# Patient Record
Sex: Female | Born: 1970
Health system: Southern US, Community
[De-identification: ages and names within clinical notes are randomized; demographics above are authoritative.]

## PROBLEM LIST (undated history)

## (undated) DIAGNOSIS — F419 Anxiety disorder, unspecified: Secondary | ICD-10-CM

## (undated) DIAGNOSIS — E785 Hyperlipidemia, unspecified: Secondary | ICD-10-CM

## (undated) DIAGNOSIS — Z789 Other specified health status: Secondary | ICD-10-CM

## (undated) DIAGNOSIS — Z0282 Encounter for adoption services: Secondary | ICD-10-CM

## (undated) DIAGNOSIS — I10 Essential (primary) hypertension: Secondary | ICD-10-CM

## (undated) DIAGNOSIS — E079 Disorder of thyroid, unspecified: Secondary | ICD-10-CM

## (undated) DIAGNOSIS — I499 Cardiac arrhythmia, unspecified: Secondary | ICD-10-CM

## (undated) HISTORY — PX: BREAST ENHANCEMENT SURGERY: SHX7

## (undated) HISTORY — PX: AUGMENTATION MAMMAPLASTY: SUR837

## (undated) HISTORY — PX: WISDOM TOOTH EXTRACTION: SHX21

## (undated) HISTORY — DX: Hyperlipidemia, unspecified: E78.5

---
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
## 2015-12-11 ENCOUNTER — Emergency Department (HOSPITAL_COMMUNITY): Payer: BLUE CROSS/BLUE SHIELD

## 2015-12-11 ENCOUNTER — Emergency Department (HOSPITAL_COMMUNITY)
Admission: EM | Admit: 2015-12-11 | Discharge: 2015-12-11 | Disposition: A | Discharge status: Home / Self Care | Payer: BLUE CROSS/BLUE SHIELD | Attending: Emergency Medicine | Admitting: Emergency Medicine

## 2015-12-11 ENCOUNTER — Encounter (HOSPITAL_COMMUNITY): Payer: Self-pay

## 2015-12-11 DIAGNOSIS — M79622 Pain in left upper arm: Secondary | ICD-10-CM | POA: Insufficient documentation

## 2015-12-11 DIAGNOSIS — R0682 Tachypnea, not elsewhere classified: Secondary | ICD-10-CM | POA: Diagnosis not present

## 2015-12-11 DIAGNOSIS — M79602 Pain in left arm: Secondary | ICD-10-CM

## 2015-12-11 LAB — CBC
HCT: 39.5 % (ref 36.0–46.0)
Hemoglobin: 12.6 g/dL (ref 12.0–15.0)
MCH: 25.9 pg — ABNORMAL LOW (ref 26.0–34.0)
MCHC: 31.9 g/dL (ref 30.0–36.0)
MCV: 81.1 fL (ref 78.0–100.0)
Platelets: 322 10*3/uL (ref 150–400)
RBC: 4.87 MIL/uL (ref 3.87–5.11)
RDW: 14.4 % (ref 11.5–15.5)
WBC: 5.5 10*3/uL (ref 4.0–10.5)

## 2015-12-11 LAB — BASIC METABOLIC PANEL
Anion gap: 7 (ref 5–15)
BUN: 8 mg/dL (ref 6–20)
CO2: 24 mmol/L (ref 22–32)
Calcium: 9.2 mg/dL (ref 8.9–10.3)
Chloride: 104 mmol/L (ref 101–111)
Creatinine, Ser: 0.56 mg/dL (ref 0.44–1.00)
GFR calc Af Amer: >60 mL/min (ref >60)
GFR calc non Af Amer: >60 mL/min (ref >60)
Glucose, Bld: 101 mg/dL — ABNORMAL HIGH (ref 65–99)
Potassium: 3.7 mmol/L (ref 3.5–5.1)
Sodium: 135 mmol/L (ref 135–145)

## 2015-12-11 LAB — I-STAT TROPONIN, ED: Troponin i, poc: 0 ng/mL (ref 0.00–0.08)

## 2015-12-11 LAB — D-DIMER, QUANTITATIVE (NOT AT ARMC): D-Dimer, Quant: 1.18 ug/mL-FEU — ABNORMAL HIGH (ref 0.00–0.50)

## 2015-12-11 MED ORDER — KETOROLAC TROMETHAMINE 15 MG/ML IJ SOLN
15.0000 mg | Freq: Once | INTRAMUSCULAR | Status: AC
Start: 2015-12-11 — End: 2015-12-11
  Administered 2015-12-11: 15 mg via INTRAVENOUS
  Filled 2015-12-11: qty 1

## 2015-12-11 MED ORDER — IOPAMIDOL (ISOVUE-370) INJECTION 76%
INTRAVENOUS | Status: AC
  Administered 2015-12-11: 100 mL
  Filled 2015-12-11: qty 100

## 2015-12-11 MED ORDER — MELOXICAM 15 MG PO TABS: 15.0000 mg | ORAL_TABLET | Freq: Every day | ORAL | 0 refills | Status: DC

## 2015-12-11 MED ORDER — METOPROLOL TARTRATE 5 MG/5ML IV SOLN
2.5000 mg | Freq: Once | INTRAVENOUS | Status: AC
Start: 2015-12-11 — End: 2015-12-11
  Administered 2015-12-11: 2.5 mg via INTRAVENOUS
  Filled 2015-12-11: qty 5

## 2015-12-11 MED ORDER — HYDROMORPHONE HCL 1 MG/ML IJ SOLN
0.5000 mg | Freq: Once | INTRAMUSCULAR | Status: AC
  Administered 2015-12-11: 0.5 mg via INTRAVENOUS
  Filled 2015-12-11: qty 1

## 2015-12-11 MED ORDER — ACETAMINOPHEN 325 MG PO TABS
650.0000 mg | ORAL_TABLET | Freq: Once | ORAL | Status: AC
  Administered 2015-12-11: 650 mg via ORAL
  Filled 2015-12-11: qty 2

## 2015-12-11 MED ORDER — ONDANSETRON HCL 4 MG/2ML IJ SOLN
4.0000 mg | Freq: Once | INTRAMUSCULAR | Status: AC
Start: 2015-12-11 — End: 2015-12-11
  Administered 2015-12-11: 4 mg via INTRAVENOUS
  Filled 2015-12-11: qty 2

## 2015-12-11 NOTE — ED Notes (Signed)
Report given to Melanie,RN 

## 2015-12-11 NOTE — ED Notes (Signed)
Pt A&Ox4, ambulatory at d/c with steady gait, NAD and states she has all of her belongings with her at d/c.

## 2015-12-11 NOTE — ED Notes (Signed)
Family at bedside. 

## 2015-12-11 NOTE — ED Notes (Signed)
MD at bedside due to pt's HR has increased to 125 again.

## 2015-12-11 NOTE — ED Provider Notes (Signed)
[facility] DEPT Provider Note   CSN: [id number] Arrival date & time: 12/11/15  [id number]  First Provider Contact:  First MD Initiated Contact with Patient 12/11/15 [phone]        History   Chief Complaint Chief Complaint  Patient presents with  . Arm Pain  . Tachycardia    HPI Joanne Hunt is a 45 y.o. female.  HPI   45yF with LUE pain. Denies trauma. Very mild symptoms over past couple days. Worsening this morning while at rest. Pain radiates from shoulder down into hand. Worse in upper arm. Some pain in L lateral neck and L ear pain. Denies numbness, tingling or loss of strength. No worse with movement. No swelling. No rash. Denies CP. Mild dyspnea. Nauseated this morning. Recently diagnosed with hyperthyroid. On metoprolol and just started taking PTU yesterday. No personal cardiac history that she is aware of. Adopted and unsure of family hx other than mother dying of cirrhosis in her 46s.   History reviewed. No pertinent past medical history.  There are no active problems to display for this patient.   History reviewed. No pertinent surgical history.  OB History    No data available       Home Medications    Prior to Admission medications   Not on File    Family History History reviewed. No pertinent family history.  Social History Social History  Substance Use Topics  . Smoking status: Not on file  . Smokeless tobacco: Not on file  . Alcohol use Not on file     Allergies   Review of patient's allergies indicates not on file.   Review of Systems Review of Systems All systems reviewed and negative, other than as noted in HPI.   Physical Exam Updated Vital Signs BP 119/83   Pulse 104   Temp 98.7 F (37.1 C) (Oral)   Resp 25   Ht 5\' 4"  (1.626 m)   Wt 142 lb (64.4 kg)   SpO2 100%   BMI 24.37 kg/m   Physical Exam  Constitutional: She appears well-developed and well-nourished.  Appears anxious. Tearful at times.   HENT:  Head: Normocephalic  and atraumatic.  Right Ear: External ear normal.  Left Ear: External ear normal.  TMs clear b/l  Eyes: Conjunctivae are normal.  Neck: Neck supple.  Cardiovascular: Regular rhythm.   No murmur heard. tachycardic  Pulmonary/Chest: Effort normal and breath sounds normal. No respiratory distress.  Abdominal: Soft. There is no tenderness.  Musculoskeletal: She exhibits no edema.  LUE normal in appearance. Symmetric as compared to R. No tenderness. Able to shoulder shrug and actively range shoulder, elbow and wrist w/o apparent discomfort. Good grip strength. Sensation intact to light touch in all dermatomes. Palpable radial pulse. Lower extremities symmetric as compared to each other. No calf tenderness. Negative Homan's. No palpable cords.   Neurological: She is alert.  Skin: Skin is warm and dry.  Psychiatric: She has a normal mood and affect.  Nursing note and vitals reviewed.    ED Treatments / Results  Labs (all labs ordered are listed, but only abnormal results are displayed) Labs Reviewed  CBC - Abnormal; Notable for the following:       Result Value   MCH 25.9 (*)    All other components within normal limits  BASIC METABOLIC PANEL  Randolm Idol, ED    EKG  EKG Interpretation  Date/Time:  Tuesday December 11 2015 06:29:15 EDT Ventricular Rate:  110 PR Interval:  118 QRS  Duration: 68 QT Interval:  308 QTC Calculation: 416 R Axis:   69 Text Interpretation:  Sinus tachycardia Otherwise normal ECG No old tracing to compare Confirmed by [facility]  MD, DAVID ([id number]) on 12/11/2015 6:41:39 AM       Radiology No results found.  Procedures Procedures (including critical care time)  Medications Ordered in ED Medications - No data to display   Initial Impression / Assessment and Plan / ED Course  I have reviewed the triage vital signs and the nursing notes.  Pertinent labs & imaging results that were available during my care of the patient were reviewed by me and  considered in my medical decision making (see chart for details).  Clinical Course   45yF with LUE pain. Cervical radiculopathy? Radiation from neck down into hand is suggestive. Denies exacerbation with movement though. Neuro exam nonfocal. Consider cardiac but many atypical features. May be some anxiety component. Very anxious on exam. Tachypnea. Mildly tremulous and tearful at times. Seems overly concerned about monitoring equipment such as pulse oximeter being too tight on her finger, etc. Consider PE, but no clear risk factors I can identify. If tachycardic but this could be from hyperthyroid, discomfort, anxiety, etc. NVI.   Final Clinical Impressions(s) / ED Diagnoses   Final diagnoses:  Left arm pain    New Prescriptions New Prescriptions   No medications on file     Virgel Manifold, MD 12/24/15 1041

## 2015-12-11 NOTE — ED Triage Notes (Signed)
Pt reports recent diagnosis of hyper thryoid and has heart racing, pt reports arm pain and not feeling well.

## 2016-02-04 ENCOUNTER — Ambulatory Visit: Payer: Self-pay | Admitting: Surgery

## 2016-02-12 ENCOUNTER — Encounter (HOSPITAL_COMMUNITY): Payer: Self-pay | Admitting: Surgery

## 2016-02-12 DIAGNOSIS — E0500 Thyrotoxicosis with diffuse goiter without thyrotoxic crisis or storm: Secondary | ICD-10-CM | POA: Diagnosis present

## 2016-02-12 NOTE — H&P (Signed)
General Surgery [facility]  Joanne Hunt. Joanne Hunt DOB: February 02, 1971 Married / Language: English / Race: White Female  History of Present Illness  The patient is a 45 year old female who presents with Graves disease.  Patient is referred by [name] for surgical management of Graves' disease. Patient's primary care physician is [name]. Patient was originally diagnosed with Graves' disease approximately 10 years ago following pregnancy. She was started on propylthiouracil and beta-blockade. She was treated for approximately 4 years and then treatment was discontinued. Patient did well for several years but then developed symptoms with weight gain and was evaluated by [name]. She was again found to be hyperthyroid and was started back on beta-blockade. She had some difficulty with anti-thyroid medications. She was seen back by her primary care physician and referred to [name] in endocrinology for further assessment. Patient is now taking metoprolol 50 mg daily. She is not on anti-thyroid medication but her TSH level is currently controlled. She now presents to discuss total thyroidectomy for definitive management of Graves' disease. Patient has had no prior head or neck surgery. There is no family history of Graves' disease or other endocrine neoplasms. Patient notes that tremors are well controlled with beta blockade. She has had no compressive symptoms.   Other Problems Thyroid Disease  Past Surgical History  Breast Augmentation Bilateral. Oral Surgery  Diagnostic Studies History Colonoscopy never Mammogram 1-3 years ago Pap Smear 1-5 years ago  Allergies Codeine Sulfate *ANALGESICS - OPIOID*  Medication History Amphetamine-Dextroamphetamine (20MG  Tablet, Oral) Active. ALPRAZolam (0.5MG  Tablet, Oral) Active. Propylthiouracil (50MG  Tablet, Oral) Active. Medications Reconciled  Social History  Caffeine  use Carbonated beverages, Coffee, Tea. No alcohol use No drug use Tobacco use Never smoker.  Family History Alcohol Abuse Father, Mother. Diabetes Mellitus Father, Mother. Heart Disease Father. Heart disease in female family member before age 72 Hypertension Father. Thyroid problems Father.  Pregnancy / Birth History Age at menarche 7 years. Contraceptive History Contraceptive implant, Oral contraceptives. Gravida 5 Length (months) of breastfeeding 3-6 Maternal age 76-20 Para 4 Regular periods   Review of Systems General Present- Weight Gain. Not Present- Appetite Loss, Chills, Fatigue, Fever, Night Sweats and Weight Loss. Skin Not Present- Change in Wart/Mole, Dryness, Hives, Jaundice, New Lesions, Non-Healing Wounds, Rash and Ulcer. HEENT Present- Hoarseness and Sore Throat. Not Present- Earache, Hearing Loss, Nose Bleed, Oral Ulcers, Ringing in the Ears, Seasonal Allergies, Sinus Pain, Visual Disturbances, Wears glasses/contact lenses and Yellow Eyes. Respiratory Not Present- Bloody sputum, Chronic Cough, Difficulty Breathing, Snoring and Wheezing. Breast Not Present- Breast Mass, Breast Pain, Nipple Discharge and Skin Changes. Cardiovascular Present- Rapid Heart Rate and Shortness of Breath. Not Present- Chest Pain, Difficulty Breathing Lying Down, Leg Cramps, Palpitations and Swelling of Extremities. Gastrointestinal Not Present- Abdominal Pain, Bloating, Bloody Stool, Change in Bowel Habits, Chronic diarrhea, Constipation, Difficulty Swallowing, Excessive gas, Gets full quickly at meals, Hemorrhoids, Indigestion, Nausea, Rectal Pain and Vomiting. Female Genitourinary Present- Frequency. Not Present- Nocturia, Painful Urination, Pelvic Pain and Urgency. Musculoskeletal Not Present- Back Pain, Joint Pain, Joint Stiffness, Muscle Pain, Muscle Weakness and Swelling of Extremities. Neurological Present- Headaches and Tremor. Not Present- Decreased Memory, Fainting,  Numbness, Seizures, Tingling, Trouble walking and Weakness. Psychiatric Present- Anxiety, Change in Sleep Pattern, Depression, Fearful and Frequent crying. Not Present- Bipolar. Endocrine Present- Excessive Hunger, Heat Intolerance and Hot flashes. Not Present- Cold Intolerance, Hair Changes and New Diabetes. Hematology Not Present- Blood Thinners, Easy Bruising, Excessive bleeding, Gland problems,  HIV and Persistent Infections.  Vitals Weight: 151 lb Height: 64in Body Surface Area: 1.74 m Body Mass Index: 25.92 kg/m  Temp.: 98.45F(Temporal)  Pulse: 84 (Regular)  BP: 128/86 (Sitting, Left Arm, Standard)   Physical Exam The physical exam findings are as follows: Note:General - appears comfortable, no distress; not diaphorectic  HEENT - normocephalic; sclerae clear, gaze conjugate; mucous membranes moist, dentition good; voice normal  Neck - asymmetric on extension; no palpable anterior or posterior cervical adenopathy; no palpable masses in the thyroid bed; right thyroid lobe slightly larger than the left; thyroid gland is slightly firm without discrete or dominant mass  Chest - clear bilaterally without rhonchi, rales, or wheeze  Cor - regular rhythm with normal rate; no significant murmur  Ext - non-tender without significant edema or lymphedema  Neuro - grossly intact; no tremor   Assessment & Plan  GRAVES' DISEASE (E05.00)  Pt Education - Pamphlet Given - The Thyroid Book: discussed with patient and provided information.  Patient presented from endocrinology for surgical management of Graves' disease. Patient is provided with written literature on thyroid surgery to review at home.  I have recommended proceeding with total thyroidectomy for surgical management of Graves' disease. The patient and I have discussed the procedure in detail. We have discussed the risk and benefits including the risk of injury to recurrent laryngeal nerves and parathyroid glands. We  have discussed the need for lifelong thyroid hormone replacement. We have discussed the hospital stay to be anticipated. We have discussed the cosmetic results to be anticipated. She understands and wishes to proceed with surgery in the near future.  The risks and benefits of the procedure have been discussed at length with the patient. The patient understands the proposed procedure, potential alternative treatments, and the course of recovery to be expected. All of the patient's questions have been answered at this time. The patient wishes to proceed with surgery.  Earnstine Regal, MD, [facility] Office: [phone]

## 2016-02-13 ENCOUNTER — Encounter (HOSPITAL_COMMUNITY): Payer: Self-pay | Admitting: Emergency Medicine

## 2016-02-13 ENCOUNTER — Other Ambulatory Visit: Payer: Self-pay

## 2016-02-13 ENCOUNTER — Encounter (HOSPITAL_COMMUNITY)
Admission: RE | Admit: 2016-02-13 | Discharge: 2016-02-13 | Disposition: A | Discharge status: Home / Self Care | Payer: BLUE CROSS/BLUE SHIELD | Source: Ambulatory Visit | Attending: Surgery | Admitting: Surgery

## 2016-02-13 ENCOUNTER — Emergency Department (HOSPITAL_COMMUNITY)
Admission: EM | Admit: 2016-02-13 | Discharge: 2016-02-16 | Disposition: A | Discharge status: Home / Self Care | Payer: BLUE CROSS/BLUE SHIELD | Attending: Emergency Medicine | Admitting: Emergency Medicine

## 2016-02-13 ENCOUNTER — Other Ambulatory Visit (HOSPITAL_COMMUNITY): Payer: BLUE CROSS/BLUE SHIELD

## 2016-02-13 DIAGNOSIS — Z79899 Other long term (current) drug therapy: Secondary | ICD-10-CM | POA: Diagnosis not present

## 2016-02-13 DIAGNOSIS — E0590 Thyrotoxicosis, unspecified without thyrotoxic crisis or storm: Secondary | ICD-10-CM

## 2016-02-13 HISTORY — DX: Disorder of thyroid, unspecified: E07.9

## 2016-02-13 LAB — BASIC METABOLIC PANEL
ANION GAP: 9 (ref 5–15)
BUN: 10 mg/dL (ref 6–20)
CALCIUM: 9.3 mg/dL (ref 8.9–10.3)
CO2: 26 mmol/L (ref 22–32)
Chloride: 104 mmol/L (ref 101–111)
Creatinine, Ser: 0.69 mg/dL (ref 0.44–1.00)
GFR calc Af Amer: >60 mL/min (ref >60)
GFR calc non Af Amer: >60 mL/min (ref >60)
GLUCOSE: 134 mg/dL — AB (ref 65–99)
POTASSIUM: 3.6 mmol/L (ref 3.5–5.1)
Sodium: 139 mmol/L (ref 135–145)

## 2016-02-13 LAB — I-STAT TROPONIN, ED: Troponin i, poc: 0.01 ng/mL (ref 0.00–0.08)

## 2016-02-13 LAB — TSH: TSH: 0.014 u[IU]/mL — ABNORMAL LOW (ref 0.350–4.500)

## 2016-02-13 LAB — CBC
HEMATOCRIT: 42 % (ref 36.0–46.0)
HEMOGLOBIN: 14 g/dL (ref 12.0–15.0)
MCH: 27.5 pg (ref 26.0–34.0)
MCHC: 33.3 g/dL (ref 30.0–36.0)
MCV: 82.4 fL (ref 78.0–100.0)
Platelets: 348 10*3/uL (ref 150–400)
RBC: 5.1 MIL/uL (ref 3.87–5.11)
RDW: 15 % (ref 11.5–15.5)
WBC: 4.1 10*3/uL (ref 4.0–10.5)

## 2016-02-13 LAB — T4, FREE: Free T4: 3.7 ng/dL — ABNORMAL HIGH (ref 0.61–1.12)

## 2016-02-13 MED ORDER — METHIMAZOLE 10 MG PO TABS
10.0000 mg | ORAL_TABLET | Freq: Two times a day (BID) | ORAL | 0 refills | Status: DC
Start: 2016-02-13 — End: 2016-03-03

## 2016-02-13 MED ORDER — METOPROLOL TARTRATE 50 MG PO TABS: 50.0000 mg | ORAL_TABLET | Freq: Two times a day (BID) | ORAL | 0 refills | Status: DC

## 2016-02-13 NOTE — ED Triage Notes (Signed)
Per pt, states she came for pre op procedures-having thyroid removed tomorrow-started having chest pain and fast HR-has not taking lopressor today

## 2016-02-13 NOTE — Progress Notes (Signed)
  [facility]  02/13/2016  Assessment & Plan: Hyperthyroidism - uncontrolled  Clinically hyperthyroid with tachycardia and hypertension, anxiety, tremulous  Discussed with Dr. Smith Hunt (anesthesia) and Dr. Laneta Hunt (ER)  Dr. Laneta Hunt contacted endocrinology (Dr. Chalmers Hunt)  Will start methimazole 10 mg BID and encourage compliance with beta blocker  Follow up to be arranged with endocrinology  Will cancel OR tomorrow and reschedule when clinically stable (4-6 weeks)        Joanne Regal, MD, [facility]       Office: [phone]    Subjective: Patient presented for pre-op evaluation with tachycardia, hypertension  Objective: Vital signs in last 24 hours: Temp:  [98 F (36.7 C)] 98 F (36.7 C) (09/27 1312) Pulse Rate:  [119-146] 128 (09/27 1509) Resp:  [17-31] 31 (09/27 1509) BP: (135-159)/(74-92) 155/74 (09/27 1509) SpO2:  [98 %-100 %] 100 % (09/27 1509) Weight:  [68 kg (150 lb)] 68 kg (150 lb) (09/27 1312)    Intake/Output from previous day: No intake/output data recorded. Intake/Output this shift: No intake/output data recorded.  Physical Exam: HEENT - sclerae clear, mucous membranes moist Neck - soft, non-tender Chest - clear bilaterally Cor - tachycardic 140's Ext - no edema, non-tender Neuro - anxious, tremulous, no focal deficits  Lab Results:   Recent Labs  02/13/16 1321  WBC 4.1  HGB 14.0  HCT 42.0  PLT 348   BMET  Recent Labs  02/13/16 1321  NA 139  K 3.6  CL 104  CO2 26  GLUCOSE 134*  BUN 10  CREATININE 0.69  CALCIUM 9.3   PT/INR No results for input(s): LABPROT, INR in the last 72 hours. Comprehensive Metabolic Panel:    Component Value Date/Time   NA 139 02/13/2016 1321   NA 135 12/11/2015 0636   K 3.6 02/13/2016 1321   K 3.7 12/11/2015 0636   CL 104 02/13/2016 1321   CL 104 12/11/2015 0636   CO2 26 02/13/2016 1321   CO2 24 12/11/2015 0636   BUN 10 02/13/2016 1321   BUN 8 12/11/2015 0636   CREATININE 0.69 02/13/2016 1321   CREATININE 0.56 12/11/2015 0636   GLUCOSE 134 (H) 02/13/2016 1321   GLUCOSE 101 (H) 12/11/2015 0636   CALCIUM 9.3 02/13/2016 1321   CALCIUM 9.2 12/11/2015 0636    Studies/Results: No results found.    Joanne Hunt M 02/13/2016  Patient ID: Joanne Hunt, female   DOB: [birth year], 45 y.o.   MRN: [id number]

## 2016-02-13 NOTE — Progress Notes (Signed)
Patient arrived for Pre-operative appointment and when hooked up to datamap to get vital signs, patient's heart rate at 127-139. Patient states she did not get her Metoprolol prescription filled and had not taken it today. Patient upset and states that she needs this surgery for her Grave's disease. Patient anxious. Consulted Dr. Suella Broad, Anesthesia and wants patient to go to Emergency room to be seen. Patient taken to Emergency Room at [facility]

## 2016-02-13 NOTE — Patient Instructions (Signed)
Joanne Hunt  02/13/2016   Your procedure is scheduled on: Thursday 02/14/2016  Report to [facility]  Entrance take [place]  elevators to 3rd floor to  [place] at  [place]   AM.  Call this number if you have problems the morning of surgery [phone]   Remember: ONLY 1 PERSON MAY GO WITH YOU TO SHORT STAY TO GET  READY MORNING OF [place].    Do not eat food or drink liquids :After Midnight.     Take these medicines the morning of surgery with A SIP OF WATER:   Metoprolol                                You may not have any metal on your body including hair pins and              piercings  Do not wear jewelry, make-up, lotions, powders or perfumes, deodorant             Do not wear nail polish.  Do not shave  48 hours prior to surgery.              Men may shave face and neck.   Do not bring valuables to the hospital. [place].  Contacts, dentures or bridgework may not be worn into surgery.  Leave suitcase in the car. After surgery it may be brought to your room.                  Please read over the following fact sheets you were given: _____________________________________________________________________             [facility] - Preparing for Surgery Before surgery, you can play an important role.  Because skin is not sterile, your skin needs to be as free of germs as possible.  You can reduce the number of germs on your skin by washing with CHG (chlorahexidine gluconate) soap before surgery.  CHG is an antiseptic cleaner which kills germs and bonds with the skin to continue killing germs even after washing. Please DO NOT use if you have an allergy to CHG or antibacterial soaps.  If your skin becomes reddened/irritated stop using the CHG and inform your nurse when you arrive at Short Stay. Do not shave (including legs and underarms) for at least 48 hours prior to the first CHG shower.   You may shave your face/neck. Please follow these instructions carefully:  1.  Shower with CHG Soap the night before surgery and the  morning of Surgery.  2.  If you choose to wash your hair, wash your hair first as usual with your  normal  shampoo.  3.  After you shampoo, rinse your hair and body thoroughly to remove the  shampoo.                           4.  Use CHG as you would any other liquid soap.  You can apply chg directly  to the skin and wash                       Gently with a scrungie or clean washcloth.  5.  Apply the CHG Soap to your body ONLY FROM THE NECK DOWN.   Do not use on face/ open                           Wound or open sores. Avoid contact with eyes, ears mouth and genitals (private parts).                       Wash face,  Genitals (private parts) with your normal soap.             6.  Wash thoroughly, paying special attention to the area where your surgery  will be performed.  7.  Thoroughly rinse your body with warm water from the neck down.  8.  DO NOT shower/wash with your normal soap after using and rinsing off  the CHG Soap.                9.  Pat yourself dry with a clean towel.            10.  Wear clean pajamas.            11.  Place clean sheets on your bed the night of your first shower and do not  sleep with pets. Day of Surgery : Do not apply any lotions/deodorants the morning of surgery.  Please wear clean clothes to the hospital/surgery center.  FAILURE TO FOLLOW THESE INSTRUCTIONS MAY RESULT IN THE CANCELLATION OF YOUR SURGERY PATIENT SIGNATURE_________________________________  NURSE SIGNATURE__________________________________  ________________________________________________________________________

## 2016-02-13 NOTE — Progress Notes (Signed)
12/11/2015- noted in EPIC CXR and EKG

## 2016-02-13 NOTE — ED Provider Notes (Signed)
[facility] DEPT Provider Note   CSN: [id number] Arrival date & time: 02/13/16  1257     History   Chief Complaint Chief Complaint  Patient presents with  . elevated HR    HPI Joanne Hunt is a 45 y.o. female.  The history is provided by the patient.  Palpitations   This is a recurrent problem. The current episode started 1 to 2 hours ago. The problem occurs constantly. The problem has been gradually improving. The problem is associated with anxiety (missed medications). On average, each episode lasts 1 day. Pertinent negatives include no diaphoresis, no fever, no abdominal pain, no nausea, no vomiting and no headaches. She has tried nothing for the symptoms. Risk factors: graves disease. Her past medical history is significant for hyperthyroidism.    Past Medical History:  Diagnosis Date  . Thyroid disease     Patient Active Problem List   Diagnosis Date Noted  . Graves' disease 02/12/2016    No past surgical history.  OB History    No data available       Home Medications    Prior to Admission medications   Medication Sig Start Date End Date Taking? Authorizing Provider  ALPRAZolam Duanne Moron) 0.5 MG tablet Take 0.5 mg by mouth 2 (two) times daily as needed for anxiety. 01/03/16  Yes Historical Provider, MD  amphetamine-dextroamphetamine (ADDERALL) 20 MG tablet Take 20 mg by mouth daily as needed (for adhd).  01/24/16  Yes Historical Provider, MD  ibuprofen (ADVIL,MOTRIN) 200 MG tablet Take 400 mg by mouth every 6 (six) hours as needed for cramping.   Yes Historical Provider, MD  metoprolol (LOPRESSOR) 50 MG tablet Take 50 mg by mouth 2 (two) times daily.   Yes Historical Provider, MD  meloxicam (MOBIC) 15 MG tablet Take 1 tablet (15 mg total) by mouth daily. Patient not taking: Reported on 02/13/2016 12/11/15   Virgel Manifold, MD    Family History No family history on file.  Social History Social History  Substance Use Topics  . Smoking status: Not on file  .  Smokeless tobacco: Not on file  . Alcohol use Not on file     Allergies   Ptu [propylthiouracil] and Codeine   Review of Systems Review of Systems  Constitutional: Negative for diaphoresis and fever.  Cardiovascular: Positive for palpitations.  Gastrointestinal: Negative for abdominal pain, nausea and vomiting.  Neurological: Negative for headaches.  All other systems reviewed and are negative.    Physical Exam Updated Vital Signs BP 135/92   Pulse 119   Temp 98 F (36.7 C) (Oral)   Resp 23   Ht 5\' 4"  (1.626 m)   Wt 150 lb (68 kg)   SpO2 100%   BMI 25.75 kg/m   Physical Exam  Constitutional: She is oriented to person, place, and time. She appears well-developed and well-nourished. No distress.  HENT:  Head: Normocephalic.  Eyes: Conjunctivae are normal.  Neck: Neck supple. No tracheal deviation present.  Cardiovascular: Regular rhythm.  Tachycardia present.   Pulmonary/Chest: Effort normal. No respiratory distress.  Abdominal: Soft. She exhibits no distension.  Musculoskeletal: Normal range of motion.  Neurological: She is alert and oriented to person, place, and time.  Skin: Skin is warm and dry.  Psychiatric: She has a normal mood and affect.     ED Treatments / Results  Labs (all labs ordered are listed, but only abnormal results are displayed) Labs Reviewed  BASIC METABOLIC PANEL - Abnormal; Notable for the following:  Result Value   Glucose, Bld 134 (*)    All other components within normal limits  TSH - Abnormal; Notable for the following:    TSH 0.014 (*)    All other components within normal limits  CBC  T4, FREE  I-STAT TROPOININ, ED    EKG  EKG Interpretation  Date/Time:  Wednesday February 13 2016 13:12:30 EDT Ventricular Rate:  131 PR Interval:    QRS Duration: 87 QT Interval:  299 QTC Calculation: 442 R Axis:   46 Text Interpretation:  Sinus tachycardia Probable left atrial enlargement Otherwise normal ECG No significant  change since last tracing Confirmed by Kynadie Yaun MD, Angelina Neece [id number]) on 02/13/2016 1:40:50 PM       Radiology No results found.  Procedures Procedures (including critical care time)  Medications Ordered in ED Medications - No data to display   Initial Impression / Assessment and Plan / ED Course  I have reviewed the triage vital signs and the nursing notes.  Pertinent labs & imaging results that were available during my care of the patient were reviewed by me and considered in my medical decision making (see chart for details).  45 y.o. female presents with tachycardia from preoperative clinic. Initially in 140s but down to low 110s with reassurance and monitoring. HRan out of her metoprolol yesterday. Dr Harlow Asa at bedside, appreciate his input. I agree with assessment Pt will need more optimal medical management pre-operatively. Discussed with Dr Valentino Nose who is Pt's endocrinologist and will start on low dose methimazole as her TSH is severely depressed suggesting active hyperthyroid. No signs of active storm currently or indication for inpatient management. Refilled BB Rx. Pt to f/u in endocrinology clinic this week for ongoing management of medications and will be reassessed for surgery at a later date. Plan to follow up with PCP as needed and return precautions discussed for worsening or new concerning symptoms.   Final Clinical Impressions(s) / ED Diagnoses   Final diagnoses:  Hyperthyroidism    New Prescriptions New Prescriptions   METHIMAZOLE (TAPAZOLE) 10 MG TABLET    Take 1 tablet (10 mg total) by mouth 2 (two) times daily.     Leo Grosser, MD 02/13/16 [phone]

## 2016-02-14 ENCOUNTER — Encounter (HOSPITAL_COMMUNITY): Admission: RE | Payer: Self-pay | Source: Ambulatory Visit

## 2016-02-14 ENCOUNTER — Ambulatory Visit (HOSPITAL_COMMUNITY): Admission: RE | Admit: 2016-02-14 | Payer: BLUE CROSS/BLUE SHIELD | Source: Ambulatory Visit | Admitting: Surgery

## 2016-02-14 SURGERY — THYROIDECTOMY
Anesthesia: General

## 2016-02-14 NOTE — Discharge Planning (Signed)
[facility] reviewed discharging chart for possible CM needs.  No needs identified.    Follow-up appointment made?n/a  Does patient have transportation Issues? n/a  Medication needs? Pt insured, no new Rx written.  Equipment Needs? N/a                                       Oxygen Needs? n/a  PT equipment ordered? n/a  H.H needed? n/a                                                H.H ordered? N/a  Amandajo Gonder J. Clydene Laming, [place], [place], [place]

## 2016-02-19 ENCOUNTER — Ambulatory Visit: Payer: Self-pay | Admitting: Surgery

## 2016-02-26 NOTE — Progress Notes (Signed)
Patient scheduled for preop.  Received and placed on chart last office visit note from Dr Chalmers Cater- endocrinologist.  Called patient and asked her where labs scheduled for 02/21/2016 had been done.  Patient stated she went to [facility] on 02/21/2016 in the am and pm to have her labs done.  Patient stated she had orders with her and Dr Chalmers Cater office was aware she was going there to have labs done.  Patient stated she was there for a long time each time and no one ever came out to get her to have her labs done.  The information above was called and notified to the Triage Nurse of Dr Debbora Presto for further followup.

## 2016-02-28 ENCOUNTER — Encounter (HOSPITAL_COMMUNITY)
Admission: RE | Admit: 2016-02-28 | Discharge: 2016-02-28 | Disposition: A | Discharge status: Home / Self Care | Payer: BLUE CROSS/BLUE SHIELD | Source: Ambulatory Visit | Attending: Surgery | Admitting: Surgery

## 2016-02-28 ENCOUNTER — Encounter (HOSPITAL_COMMUNITY): Payer: Self-pay

## 2016-02-28 DIAGNOSIS — Z01812 Encounter for preprocedural laboratory examination: Secondary | ICD-10-CM | POA: Insufficient documentation

## 2016-02-28 DIAGNOSIS — E0500 Thyrotoxicosis with diffuse goiter without thyrotoxic crisis or storm: Secondary | ICD-10-CM | POA: Diagnosis not present

## 2016-02-28 HISTORY — DX: Cardiac arrhythmia, unspecified: I49.9

## 2016-02-28 HISTORY — DX: Anxiety disorder, unspecified: F41.9

## 2016-02-28 HISTORY — DX: Other specified health status: Z78.9

## 2016-02-28 HISTORY — DX: Encounter for adoption services: Z02.82

## 2016-02-28 LAB — HCG, SERUM, QUALITATIVE: PREG SERUM: NEGATIVE

## 2016-02-28 LAB — CBC
HCT: 42.4 % (ref 36.0–46.0)
Hemoglobin: 14.1 g/dL (ref 12.0–15.0)
MCH: 27.5 pg (ref 26.0–34.0)
MCHC: 33.3 g/dL (ref 30.0–36.0)
MCV: 82.7 fL (ref 78.0–100.0)
Platelets: 366 K/uL (ref 150–400)
RBC: 5.13 MIL/uL — ABNORMAL HIGH (ref 3.87–5.11)
RDW: 14.4 % (ref 11.5–15.5)
WBC: 4.9 K/uL (ref 4.0–10.5)

## 2016-02-28 NOTE — Pre-Procedure Instructions (Addendum)
EKG 02-13-16 Epic.CXR 12-11-15 Epic.

## 2016-02-28 NOTE — Patient Instructions (Addendum)
Joanne Hunt  02/28/2016   Your procedure is scheduled on: 03-03-16  Report to [facility] Main  Entrance take [facility]  elevators to 3rd floor to  [place] at   [place] AM.  Call this number if you have problems the morning of surgery [phone]   Remember: ONLY 1 PERSON MAY GO WITH YOU TO SHORT STAY TO GET  READY MORNING OF [place].  Do not eat food or drink liquids :After Midnight.     Take these medicines the morning of surgery with A SIP OF WATER: Methimazole. Metoprolol. DO NOT TAKE ANY DIABETIC MEDICATIONS DAY OF YOUR SURGERY                               You may not have any metal on your body including hair pins and              piercings  Do not wear jewelry, make-up, lotions, powders or perfumes, deodorant             Do not wear nail polish.  Do not shave  48 hours prior to surgery.              Men may shave face and neck.   Do not bring valuables to the hospital. [place].  Contacts, dentures or bridgework may not be worn into surgery.  Leave suitcase in the car. After surgery it may be brought to your room.     Patients discharged the day of surgery will not be allowed to drive home.  Name and phone number of your driver: Randall Hiss- spouse [phone] cell  Special Instructions: N/A              Please read over the following fact sheets you were given: _____________________________________________________________________             [facility] - Preparing for Surgery Before surgery, you can play an important role.  Because skin is not sterile, your skin needs to be as free of germs as possible.  You can reduce the number of germs on your skin by washing with CHG (chlorahexidine gluconate) soap before surgery.  CHG is an antiseptic cleaner which kills germs and bonds with the skin to continue killing germs even after washing. Please DO NOT use if you have an allergy to CHG or  antibacterial soaps.  If your skin becomes reddened/irritated stop using the CHG and inform your nurse when you arrive at Short Stay. Do not shave (including legs and underarms) for at least 48 hours prior to the first CHG shower.  You may shave your face/neck. Please follow these instructions carefully:  1.  Shower with CHG Soap the night before surgery and the  morning of Surgery.  2.  If you choose to wash your hair, wash your hair first as usual with your  normal  shampoo.  3.  After you shampoo, rinse your hair and body thoroughly to remove the  shampoo.                           4.  Use CHG as you would any other liquid soap.  You can apply chg  directly  to the skin and wash                       Gently with a scrungie or clean washcloth.  5.  Apply the CHG Soap to your body ONLY FROM THE NECK DOWN.   Do not use on face/ open                           Wound or open sores. Avoid contact with eyes, ears mouth and genitals (private parts).                       Wash face,  Genitals (private parts) with your normal soap.             6.  Wash thoroughly, paying special attention to the area where your surgery  will be performed.  7.  Thoroughly rinse your body with warm water from the neck down.  8.  DO NOT shower/wash with your normal soap after using and rinsing off  the CHG Soap.                9.  Pat yourself dry with a clean towel.            10.  Wear clean pajamas.            11.  Place clean sheets on your bed the night of your first shower and do not  sleep with pets. Day of Surgery : Do not apply any lotions/deodorants the morning of surgery.  Please wear clean clothes to the hospital/surgery center.  FAILURE TO FOLLOW THESE INSTRUCTIONS MAY RESULT IN THE CANCELLATION OF YOUR SURGERY PATIENT SIGNATURE_________________________________  NURSE SIGNATURE__________________________________  ________________________________________________________________________

## 2016-02-29 NOTE — Pre-Procedure Instructions (Signed)
02-29-16 1100 AM- labs done 02-27-16 CBC/d, Free T4, Hepatic panel, TSH results placed with chart.

## 2016-03-02 ENCOUNTER — Encounter (HOSPITAL_COMMUNITY): Payer: Self-pay | Admitting: Surgery

## 2016-03-03 ENCOUNTER — Ambulatory Visit (HOSPITAL_COMMUNITY): Payer: BLUE CROSS/BLUE SHIELD | Admitting: Anesthesiology

## 2016-03-03 ENCOUNTER — Ambulatory Visit (HOSPITAL_COMMUNITY): Payer: BLUE CROSS/BLUE SHIELD

## 2016-03-03 ENCOUNTER — Encounter (HOSPITAL_COMMUNITY)
Admission: RE | Disposition: A | Discharge status: Home / Self Care | Payer: Self-pay | Source: Ambulatory Visit | Attending: Surgery

## 2016-03-03 ENCOUNTER — Observation Stay (HOSPITAL_COMMUNITY)
Admission: RE | Admit: 2016-03-03 | Discharge: 2016-03-04 | Disposition: A | Discharge status: Home / Self Care | Payer: BLUE CROSS/BLUE SHIELD | Source: Ambulatory Visit | Attending: Surgery | Admitting: Surgery

## 2016-03-03 ENCOUNTER — Encounter (HOSPITAL_COMMUNITY): Payer: Self-pay | Admitting: *Deleted

## 2016-03-03 DIAGNOSIS — Z885 Allergy status to narcotic agent status: Secondary | ICD-10-CM | POA: Insufficient documentation

## 2016-03-03 DIAGNOSIS — E063 Autoimmune thyroiditis: Secondary | ICD-10-CM | POA: Insufficient documentation

## 2016-03-03 DIAGNOSIS — Z01818 Encounter for other preprocedural examination: Secondary | ICD-10-CM

## 2016-03-03 DIAGNOSIS — E0500 Thyrotoxicosis with diffuse goiter without thyrotoxic crisis or storm: Secondary | ICD-10-CM | POA: Diagnosis not present

## 2016-03-03 HISTORY — PX: THYROIDECTOMY: SHX17

## 2016-03-03 SURGERY — THYROIDECTOMY
Anesthesia: General | Site: Neck

## 2016-03-03 MED ORDER — ONDANSETRON HCL 4 MG/2ML IJ SOLN
INTRAMUSCULAR | Status: DC | PRN
  Administered 2016-03-03: 4 mg via INTRAVENOUS

## 2016-03-03 MED ORDER — KETOROLAC TROMETHAMINE 30 MG/ML IJ SOLN
30.0000 mg | Freq: Once | INTRAMUSCULAR | Status: AC
  Administered 2016-03-03: 30 mg via INTRAVENOUS

## 2016-03-03 MED ORDER — LIDOCAINE 2% (20 MG/ML) 5 ML SYRINGE
INTRAMUSCULAR | Status: DC | PRN
  Administered 2016-03-03: 80 mg via INTRAVENOUS

## 2016-03-03 MED ORDER — ACETAMINOPHEN 325 MG PO TABS: 650.0000 mg | ORAL_TABLET | Freq: Four times a day (QID) | ORAL | Status: DC | PRN

## 2016-03-03 MED ORDER — ACETAMINOPHEN 650 MG RE SUPP
650.0000 mg | Freq: Four times a day (QID) | RECTAL | Status: DC | PRN
Start: 2016-03-03 — End: 2016-03-04

## 2016-03-03 MED ORDER — METOPROLOL TARTRATE 50 MG PO TABS
50.0000 mg | ORAL_TABLET | Freq: Two times a day (BID) | ORAL | Status: DC
  Administered 2016-03-03 – 2016-03-04 (×2): 50 mg via ORAL
  Filled 2016-03-03 (×2): qty 1

## 2016-03-03 MED ORDER — ALPRAZOLAM 0.5 MG PO TABS: 0.5000 mg | ORAL_TABLET | Freq: Two times a day (BID) | ORAL | Status: DC | PRN

## 2016-03-03 MED ORDER — SUFENTANIL CITRATE 50 MCG/ML IV SOLN
INTRAVENOUS | Status: DC | PRN
  Administered 2016-03-03: 5 ug via INTRAVENOUS
  Administered 2016-03-03: 10 ug via INTRAVENOUS
  Administered 2016-03-03 (×2): 5 ug via INTRAVENOUS

## 2016-03-03 MED ORDER — CEFAZOLIN SODIUM-DEXTROSE 2-4 GM/100ML-% IV SOLN
INTRAVENOUS | Status: AC
  Filled 2016-03-03: qty 100

## 2016-03-03 MED ORDER — FENTANYL CITRATE (PF) 100 MCG/2ML IJ SOLN
INTRAMUSCULAR | Status: DC | PRN
  Administered 2016-03-03 (×2): 50 ug via INTRAVENOUS

## 2016-03-03 MED ORDER — LACTATED RINGERS IV SOLN
INTRAVENOUS | Status: DC
  Administered 2016-03-03: 12:00:00 via INTRAVENOUS
  Administered 2016-03-03: 1000 mL via INTRAVENOUS
  Administered 2016-03-03: 11:00:00 via INTRAVENOUS

## 2016-03-03 MED ORDER — ACETAMINOPHEN 10 MG/ML IV SOLN
INTRAVENOUS | Status: AC
  Filled 2016-03-03: qty 100

## 2016-03-03 MED ORDER — MIDAZOLAM HCL 2 MG/2ML IJ SOLN
INTRAMUSCULAR | Status: DC | PRN
  Administered 2016-03-03: 2 mg via INTRAVENOUS

## 2016-03-03 MED ORDER — HYDROCODONE-ACETAMINOPHEN 5-325 MG PO TABS
1.0000 | ORAL_TABLET | ORAL | Status: DC | PRN
  Administered 2016-03-03: 1 via ORAL
  Administered 2016-03-04: 2 via ORAL
  Administered 2016-03-04: 1 via ORAL
  Filled 2016-03-03: qty 2
  Filled 2016-03-03 (×2): qty 1

## 2016-03-03 MED ORDER — ONDANSETRON 4 MG PO TBDP: 4.0000 mg | ORAL_TABLET | Freq: Four times a day (QID) | ORAL | Status: DC | PRN

## 2016-03-03 MED ORDER — HYDROMORPHONE HCL 1 MG/ML IJ SOLN
0.2500 mg | INTRAMUSCULAR | Status: DC | PRN
  Administered 2016-03-03 (×4): 0.5 mg via INTRAVENOUS

## 2016-03-03 MED ORDER — HYDROMORPHONE HCL 1 MG/ML IJ SOLN
INTRAMUSCULAR | Status: AC
  Filled 2016-03-03: qty 1

## 2016-03-03 MED ORDER — PROPOFOL 10 MG/ML IV BOLUS
INTRAVENOUS | Status: DC | PRN
  Administered 2016-03-03: 120 mg via INTRAVENOUS

## 2016-03-03 MED ORDER — 0.9 % SODIUM CHLORIDE (POUR BTL) OPTIME
TOPICAL | Status: DC | PRN
  Administered 2016-03-03: 1000 mL

## 2016-03-03 MED ORDER — ONDANSETRON HCL 4 MG/2ML IJ SOLN: 4.0000 mg | Freq: Four times a day (QID) | INTRAMUSCULAR | Status: DC | PRN

## 2016-03-03 MED ORDER — PROMETHAZINE HCL 25 MG/ML IJ SOLN: 6.2500 mg | INTRAMUSCULAR | Status: DC | PRN

## 2016-03-03 MED ORDER — CEFAZOLIN SODIUM-DEXTROSE 2-4 GM/100ML-% IV SOLN
2.0000 g | INTRAVENOUS | Status: AC
  Administered 2016-03-03: 2 g via INTRAVENOUS
  Filled 2016-03-03: qty 100

## 2016-03-03 MED ORDER — ROCURONIUM BROMIDE 10 MG/ML (PF) SYRINGE
PREFILLED_SYRINGE | INTRAVENOUS | Status: DC | PRN
  Administered 2016-03-03: 40 mg via INTRAVENOUS
  Administered 2016-03-03: 10 mg via INTRAVENOUS

## 2016-03-03 MED ORDER — SUGAMMADEX SODIUM 200 MG/2ML IV SOLN
INTRAVENOUS | Status: DC | PRN
  Administered 2016-03-03: 200 mg via INTRAVENOUS

## 2016-03-03 MED ORDER — HYDROMORPHONE HCL 1 MG/ML IJ SOLN
1.0000 mg | INTRAMUSCULAR | Status: DC | PRN
Start: 2016-03-03 — End: 2016-03-04
  Administered 2016-03-03 (×2): 1 mg via INTRAVENOUS
  Filled 2016-03-03 (×2): qty 1

## 2016-03-03 MED ORDER — CALCIUM CARBONATE 1250 (500 CA) MG PO TABS
2.0000 | ORAL_TABLET | Freq: Three times a day (TID) | ORAL | Status: DC
  Administered 2016-03-03 – 2016-03-04 (×2): 1000 mg via ORAL
  Filled 2016-03-03 (×3): qty 1

## 2016-03-03 MED ORDER — KCL IN DEXTROSE-NACL 20-5-0.45 MEQ/L-%-% IV SOLN
INTRAVENOUS | Status: DC
  Administered 2016-03-03: 15:00:00 via INTRAVENOUS
  Filled 2016-03-03 (×2): qty 1000

## 2016-03-03 MED ORDER — KETOROLAC TROMETHAMINE 30 MG/ML IJ SOLN
INTRAMUSCULAR | Status: AC
  Filled 2016-03-03: qty 1

## 2016-03-03 MED ORDER — PHENYLEPHRINE HCL 10 MG/ML IJ SOLN
INTRAMUSCULAR | Status: DC | PRN
  Administered 2016-03-03 (×5): 40 ug via INTRAVENOUS
  Administered 2016-03-03: 80 ug via INTRAVENOUS

## 2016-03-03 MED ORDER — ACETAMINOPHEN 10 MG/ML IV SOLN
1000.0000 mg | Freq: Once | INTRAVENOUS | Status: AC
  Administered 2016-03-03: 1000 mg via INTRAVENOUS

## 2016-03-03 MED ORDER — AMPHETAMINE-DEXTROAMPHETAMINE 20 MG PO TABS: 20.0000 mg | ORAL_TABLET | Freq: Every day | ORAL | Status: DC | PRN

## 2016-03-03 SURGICAL SUPPLY — 39 items
APL SKNCLS STERI-STRIP NONHPOA (GAUZE/BANDAGES/DRESSINGS)
ATTRACTOMAT 16X20 MAGNETIC DRP (DRAPES) ×3 IMPLANT
BENZOIN TINCTURE PRP APPL 2/3 (GAUZE/BANDAGES/DRESSINGS) IMPLANT
BLADE SURG 15 STRL LF DISP TIS (BLADE) ×1 IMPLANT
BLADE SURG 15 STRL SS (BLADE) ×3
CHLORAPREP W/TINT 26ML (MISCELLANEOUS) ×4 IMPLANT
CLIP TI MEDIUM 6 (CLIP) ×8 IMPLANT
CLIP TI WIDE RED SMALL 6 (CLIP) ×8 IMPLANT
CLOSURE STERI-STRIP 1/4X4 (GAUZE/BANDAGES/DRESSINGS) ×2 IMPLANT
CLOSURE WOUND 1/2 X4 (GAUZE/BANDAGES/DRESSINGS) ×1
COVER SURGICAL LIGHT HANDLE (MISCELLANEOUS) ×3 IMPLANT
DISSECTOR ROUND CHERRY 3/8 STR (MISCELLANEOUS) IMPLANT
DRAPE LAPAROTOMY T 98X78 PEDS (DRAPES) ×3 IMPLANT
ELECT PENCIL ROCKER SW 15FT (MISCELLANEOUS) ×3 IMPLANT
ELECT REM PT RETURN 9FT ADLT (ELECTROSURGICAL) ×3
ELECTRODE REM PT RTRN 9FT ADLT (ELECTROSURGICAL) ×1 IMPLANT
GAUZE SPONGE 4X4 12PLY STRL (GAUZE/BANDAGES/DRESSINGS) ×2 IMPLANT
GAUZE SPONGE 4X4 16PLY XRAY LF (GAUZE/BANDAGES/DRESSINGS) ×3 IMPLANT
GLOVE SURG ORTHO 8.0 STRL STRW (GLOVE) ×3 IMPLANT
GOWN STRL REUS W/TWL XL LVL3 (GOWN DISPOSABLE) ×6 IMPLANT
HEMOSTAT SURGICEL 2X4 FIBR (HEMOSTASIS) ×3 IMPLANT
ILLUMINATOR WAVEGUIDE N/F (MISCELLANEOUS) ×2 IMPLANT
KIT BASIN OR (CUSTOM PROCEDURE TRAY) ×3 IMPLANT
LIGHT WAVEGUIDE WIDE FLAT (MISCELLANEOUS) IMPLANT
PACK BASIC VI WITH GOWN DISP (CUSTOM PROCEDURE TRAY) ×3 IMPLANT
SHEARS HARMONIC 9CM CVD (BLADE) ×3 IMPLANT
STAPLER VISISTAT 35W (STAPLE) IMPLANT
STRIP CLOSURE SKIN 1/2X4 (GAUZE/BANDAGES/DRESSINGS) ×2 IMPLANT
SUT MNCRL AB 4-0 PS2 18 (SUTURE) ×3 IMPLANT
SUT SILK 2 0 (SUTURE)
SUT SILK 2-0 18XBRD TIE 12 (SUTURE) IMPLANT
SUT SILK 3 0 (SUTURE)
SUT SILK 3-0 18XBRD TIE 12 (SUTURE) IMPLANT
SUT VIC AB 3-0 SH 18 (SUTURE) ×6 IMPLANT
SYR BULB IRRIGATION 50ML (SYRINGE) ×3 IMPLANT
TAPE CLOTH SURG 4X10 WHT LF (GAUZE/BANDAGES/DRESSINGS) ×2 IMPLANT
TOWEL OR 17X26 10 PK STRL BLUE (TOWEL DISPOSABLE) ×3 IMPLANT
TOWEL OR NON WOVEN STRL DISP B (DISPOSABLE) ×3 IMPLANT
YANKAUER SUCT BULB TIP 10FT TU (MISCELLANEOUS) ×3 IMPLANT

## 2016-03-03 NOTE — Anesthesia Procedure Notes (Signed)
Procedure Name: Intubation Date/Time: 03/03/2016 11:05 AM Performed by: Cynda Familia Pre-anesthesia Checklist: Patient identified, Emergency Drugs available, Suction available and Patient being monitored Patient Re-evaluated:Patient Re-evaluated prior to inductionOxygen Delivery Method: Circle System Utilized Preoxygenation: Pre-oxygenation with 100% oxygen Intubation Type: IV induction Ventilation: Mask ventilation without difficulty Laryngoscope Size: Miller and 2 Grade View: Grade I Tube type: Oral Number of attempts: 1 Airway Equipment and Method: Stylet Placement Confirmation: ETT inserted through vocal cords under direct vision,  positive ETCO2 and breath sounds checked- equal and bilateral Secured at: 22 cm Tube secured with: Tape Dental Injury: Teeth and Oropharynx as per pre-operative assessment  Comments: Smooth IV indcution [place]-- intubation AM CRNA atraumatic--- teeth and mouth as preop-- bilat BS [place]

## 2016-03-03 NOTE — Op Note (Signed)
Procedure Note  Pre-operative Diagnosis:  Graves' disease   Post-operative Diagnosis:  same  Surgeon:  Earnstine Regal, MD, FACS  Assistant:  Georganna Skeans, MD, FACS   Procedure:  Total thyroidectomy  Anesthesia:  General  Estimated Blood Loss:  minimal  Drains: none         Specimen: thyroid to pathology  Indications:  The patient is a 45 year old female who presents with Graves disease.  Patient is referred by [name] for surgical management of Graves' disease. Patient's primary care physician is [name]. Patient was originally diagnosed with Graves' disease approximately 10 years ago following pregnancy. She was started on propylthiouracil and beta-blockade. She was treated for approximately 4 years and then treatment was discontinued. Patient did well for several years but then developed symptoms with weight gain and was evaluated by [name]. She was again found to be hyperthyroid and was started back on beta-blockade. She had some difficulty with anti-thyroid medications. She was seen back by her primary care physician and referred to [name] in endocrinology for further assessment. She now presents for total thyroidectomy for definitive management of Graves' disease.   Procedure Details: Procedure was done in OR #4 at the [facility].  The patient was brought to the operating room and placed in a supine position on the operating room table.  Following administration of general anesthesia, the patient was positioned and then prepped and draped in the usual aseptic fashion.  After ascertaining that an adequate level of anesthesia had been achieved, a Kocher incision was made with #15 blade.  Dissection was carried through subcutaneous tissues and platysma. Hemostasis was achieved with the electrocautery.  Skin flaps were elevated cephalad and caudad from the thyroid notch to the sternal notch.  The [place]ner self-retaining retractor was  placed for exposure.  Strap muscles were incised in the midline and dissection was begun on the left side.  Strap muscles were reflected laterally.  Left thyroid lobe was mildly enlarged without dominant nodules.  The left lobe was gently mobilized with blunt dissection.  Superior pole vessels were dissected out and divided individually between small and medium Ligaclips with the Harmonic scalpel.  The thyroid lobe was rolled anteriorly.  Branches of the inferior thyroid artery were divided between small Ligaclips with the Harmonic scalpel.  Inferior venous tributaries were divided between Ligaclips.  Both the superior and inferior parathyroid glands were identified and preserved on their vascular pedicles.  The recurrent laryngeal nerve was identified and preserved along its course.  The ligament of Gwenlyn Found was released with the electrocautery and the gland was mobilized onto the anterior trachea. Isthmus was mobilized across the midline.  There was a small pyramidal lobe present which was resected en bloc with the isthmus.  Dry pack was placed in the left neck.  Next, the right thyroid lobe was gently mobilized with blunt dissection.  Right thyroid lobe was mildly enlarged without dominant nodules.  Superior pole vessels were dissected out and divided between small and medium Ligaclips with the Harmonic scalpel.  Superior parathyroid was identified and preserved.  Inferior venous tributaries were divided between medium Ligaclips with the Harmonic scalpel.  The right thyroid lobe was rolled anteriorly and the branches of the inferior thyroid artery divided between small Ligaclips.  The right recurrent laryngeal nerve was identified and preserved along its course.  The ligament of Gwenlyn Found was released with the electrocautery.  The right thyroid lobe was mobilized onto the anterior  trachea and the remainder of the thyroid was dissected off the anterior trachea and the thyroid was completely excised.  A suture was used  to mark the right lobe. The entire thyroid gland was submitted to pathology for review.  The neck was irrigated with warm saline.  Fibrillar was placed throughout the operative field.  Strap muscles were reapproximated in the midline with interrupted 3-0 Vicryl sutures.  Platysma was closed with interrupted 3-0 Vicryl sutures.  Skin was closed with a running 4-0 Monocryl subcuticular suture.  Wound was washed and dried and steri-strips were applied.  Dry gauze dressing was placed.  The patient was awakened from anesthesia and brought to the recovery room.  The patient tolerated the procedure well.   Earnstine Regal, MD, [facility] Office: [phone]

## 2016-03-03 NOTE — Interval H&P Note (Signed)
History and Physical Interval Note:  03/03/2016 10:18 AM  Joanne Hunt  has presented today for surgery, with the diagnosis of Graves' disease.  The various methods of treatment have been discussed with the patient and family. After consideration of risks, benefits and other options for treatment, the patient has consented to    Procedure(s): TOTAL THYROIDECTOMY (N/A) as a surgical intervention .    The patient's history has been reviewed, patient examined, no change in status, stable for surgery.  I have reviewed the patient's chart and labs.  Questions were answered to the patient's satisfaction.    Earnstine Regal, MD, [facility] Office: [place]

## 2016-03-03 NOTE — Anesthesia Postprocedure Evaluation (Signed)
Anesthesia Post Note  Patient: Joanne Hunt  Procedure(s) Performed: Procedure(s) (LRB): TOTAL THYROIDECTOMY (N/A)  Patient location during evaluation: PACU Anesthesia Type: General Level of consciousness: awake and alert Pain management: pain level controlled Vital Signs Assessment: post-procedure vital signs reviewed and stable Respiratory status: spontaneous breathing, nonlabored ventilation, respiratory function stable and patient connected to nasal cannula oxygen Cardiovascular status: blood pressure returned to baseline and stable Postop Assessment: no signs of nausea or vomiting Anesthetic complications: no    Last Vitals:  Vitals:   03/03/16 1345 03/03/16 1400  BP: (!) 145/67 (!) 157/67  Pulse: 73 73  Resp: 20 18  Temp: 36.9 C 37.2 C    Last Pain:  Vitals:   03/03/16 1400  TempSrc:   PainSc: 3                  Shree Espey,JAMES TERRILL

## 2016-03-03 NOTE — Anesthesia Preprocedure Evaluation (Addendum)
Anesthesia Evaluation  Patient identified by MRN, date of birth, ID band Patient awake    Reviewed: Allergy & Precautions, NPO status , Patient's Chart, lab work & pertinent test results  History of Anesthesia Complications Negative for: history of anesthetic complications  Airway Mallampati: I  TM Distance: >3 FB Neck ROM: Full    Dental  (+) Teeth Intact   Pulmonary neg pulmonary ROS,    breath sounds clear to auscultation       Cardiovascular + dysrhythmias  Rhythm:Regular Rate:Tachycardia     Neuro/Psych negative neurological ROS     GI/Hepatic negative GI ROS, Neg liver ROS,   Endo/Other  Hyperthyroidism   Renal/GU negative Renal ROS     Musculoskeletal   Abdominal   Peds  Hematology   Anesthesia Other Findings   Reproductive/Obstetrics                            Anesthesia Physical Anesthesia Plan  ASA: II  Anesthesia Plan: General   Post-op Pain Management:    Induction: Intravenous  Airway Management Planned: Oral ETT  Additional Equipment:   Intra-op Plan:   Post-operative Plan:   Informed Consent: I have reviewed the patients History and Physical, chart, labs and discussed the procedure including the risks, benefits and alternatives for the proposed anesthesia with the patient or authorized representative who has indicated his/her understanding and acceptance.   Dental advisory given  Plan Discussed with: CRNA  Anesthesia Plan Comments:         Anesthesia Quick Evaluation

## 2016-03-03 NOTE — Transfer of Care (Signed)
Immediate Anesthesia Transfer of Care Note  Patient: Joanne Hunt  Procedure(s) Performed: Procedure(s): TOTAL THYROIDECTOMY (N/A)  Patient Location: PACU  Anesthesia Type:General  Level of Consciousness: awake and alert   Airway & Oxygen Therapy: Patient Spontanous Breathing and Patient connected to face mask oxygen  Post-op Assessment: Report given to RN and Post -op Vital signs reviewed and stable  Post vital signs: Reviewed and stable  Last Vitals:  Vitals:   03/03/16 0814  BP: 122/81  Pulse: 71  Resp: 16  Temp: 37.2 C    Last Pain:  Vitals:   03/03/16 0814  TempSrc: Oral      Patients Stated Pain Goal: 3 ([id number])  Complications: No apparent anesthesia complications

## 2016-03-03 NOTE — H&P (View-Only) (Signed)
General Surgery [facility]  Joanne Hunt DOB: Aug 24, 1970 Married / Language: English / Race: White Female  History of Present Illness  The patient is a 45 year old female who presents with Graves disease.  Patient is referred by [name] for surgical management of Graves' disease. Patient's primary care physician is [name]. Patient was originally diagnosed with Graves' disease approximately 10 years ago following pregnancy. She was started on propylthiouracil and beta-blockade. She was treated for approximately 4 years and then treatment was discontinued. Patient did well for several years but then developed symptoms with weight gain and was evaluated by [name]. She was again found to be hyperthyroid and was started back on beta-blockade. She had some difficulty with anti-thyroid medications. She was seen back by her primary care physician and referred to [name] in endocrinology for further assessment. Patient is now taking metoprolol 50 mg daily. She is not on anti-thyroid medication but her TSH level is currently controlled. She now presents to discuss total thyroidectomy for definitive management of Graves' disease. Patient has had no prior head or neck surgery. There is no family history of Graves' disease or other endocrine neoplasms. Patient notes that tremors are well controlled with beta blockade. She has had no compressive symptoms.   Other Problems Thyroid Disease  Past Surgical History  Breast Augmentation Bilateral. Oral Surgery  Diagnostic Studies History Colonoscopy never Mammogram 1-3 years ago Pap Smear 1-5 years ago  Allergies Codeine Sulfate *ANALGESICS - OPIOID*  Medication History Amphetamine-Dextroamphetamine (20MG  Tablet, Oral) Active. ALPRAZolam (0.5MG  Tablet, Oral) Active. Propylthiouracil (50MG  Tablet, Oral) Active. Medications Reconciled  Social History  Caffeine  use Carbonated beverages, Coffee, Tea. No alcohol use No drug use Tobacco use Never smoker.  Family History Alcohol Abuse Father, Mother. Diabetes Mellitus Father, Mother. Heart Disease Father. Heart disease in female family member before age 62 Hypertension Father. Thyroid problems Father.  Pregnancy / Birth History Age at menarche 57 years. Contraceptive History Contraceptive implant, Oral contraceptives. Gravida 5 Length (months) of breastfeeding 3-6 Maternal age 81-20 Para 4 Regular periods   Review of Systems General Present- Weight Gain. Not Present- Appetite Loss, Chills, Fatigue, Fever, Night Sweats and Weight Loss. Skin Not Present- Change in Wart/Mole, Dryness, Hives, Jaundice, New Lesions, Non-Healing Wounds, Rash and Ulcer. HEENT Present- Hoarseness and Sore Throat. Not Present- Earache, Hearing Loss, Nose Bleed, Oral Ulcers, Ringing in the Ears, Seasonal Allergies, Sinus Pain, Visual Disturbances, Wears glasses/contact lenses and Yellow Eyes. Respiratory Not Present- Bloody sputum, Chronic Cough, Difficulty Breathing, Snoring and Wheezing. Breast Not Present- Breast Mass, Breast Pain, Nipple Discharge and Skin Changes. Cardiovascular Present- Rapid Heart Rate and Shortness of Breath. Not Present- Chest Pain, Difficulty Breathing Lying Down, Leg Cramps, Palpitations and Swelling of Extremities. Gastrointestinal Not Present- Abdominal Pain, Bloating, Bloody Stool, Change in Bowel Habits, Chronic diarrhea, Constipation, Difficulty Swallowing, Excessive gas, Gets full quickly at meals, Hemorrhoids, Indigestion, Nausea, Rectal Pain and Vomiting. Female Genitourinary Present- Frequency. Not Present- Nocturia, Painful Urination, Pelvic Pain and Urgency. Musculoskeletal Not Present- Back Pain, Joint Pain, Joint Stiffness, Muscle Pain, Muscle Weakness and Swelling of Extremities. Neurological Present- Headaches and Tremor. Not Present- Decreased Memory, Fainting,  Numbness, Seizures, Tingling, Trouble walking and Weakness. Psychiatric Present- Anxiety, Change in Sleep Pattern, Depression, Fearful and Frequent crying. Not Present- Bipolar. Endocrine Present- Excessive Hunger, Heat Intolerance and Hot flashes. Not Present- Cold Intolerance, Hair Changes and New Diabetes. Hematology Not Present- Blood Thinners, Easy Bruising, Excessive bleeding, Gland problems,  HIV and Persistent Infections.  Vitals Weight: 151 lb Height: 64in Body Surface Area: 1.74 m Body Mass Index: 25.92 kg/m  Temp.: 98.61F(Temporal)  Pulse: 84 (Regular)  BP: 128/86 (Sitting, Left Arm, Standard)   Physical Exam The physical exam findings are as follows: Note:General - appears comfortable, no distress; not diaphorectic  HEENT - normocephalic; sclerae clear, gaze conjugate; mucous membranes moist, dentition good; voice normal  Neck - asymmetric on extension; no palpable anterior or posterior cervical adenopathy; no palpable masses in the thyroid bed; right thyroid lobe slightly larger than the left; thyroid gland is slightly firm without discrete or dominant mass  Chest - clear bilaterally without rhonchi, rales, or wheeze  Cor - regular rhythm with normal rate; no significant murmur  Ext - non-tender without significant edema or lymphedema  Neuro - grossly intact; no tremor   Assessment & Plan  GRAVES' DISEASE (E05.00)  Pt Education - Pamphlet Given - The Thyroid Book: discussed with patient and provided information.  Patient presented from endocrinology for surgical management of Graves' disease. Patient is provided with written literature on thyroid surgery to review at home.  I have recommended proceeding with total thyroidectomy for surgical management of Graves' disease. The patient and I have discussed the procedure in detail. We have discussed the risk and benefits including the risk of injury to recurrent laryngeal nerves and parathyroid glands. We  have discussed the need for lifelong thyroid hormone replacement. We have discussed the hospital stay to be anticipated. We have discussed the cosmetic results to be anticipated. She understands and wishes to proceed with surgery in the near future.  The risks and benefits of the procedure have been discussed at length with the patient. The patient understands the proposed procedure, potential alternative treatments, and the course of recovery to be expected. All of the patient's questions have been answered at this time. The patient wishes to proceed with surgery.  Earnstine Regal, MD, [facility] Office: [phone]

## 2016-03-04 DIAGNOSIS — E0500 Thyrotoxicosis with diffuse goiter without thyrotoxic crisis or storm: Secondary | ICD-10-CM | POA: Diagnosis not present

## 2016-03-04 LAB — BASIC METABOLIC PANEL
Anion gap: 6 (ref 5–15)
BUN: 6 mg/dL (ref 6–20)
CALCIUM: 7.6 mg/dL — AB (ref 8.9–10.3)
CO2: 25 mmol/L (ref 22–32)
CREATININE: 0.51 mg/dL (ref 0.44–1.00)
Chloride: 106 mmol/L (ref 101–111)
GFR calc Af Amer: >60 mL/min (ref >60)
GLUCOSE: 121 mg/dL — AB (ref 65–99)
POTASSIUM: 3.7 mmol/L (ref 3.5–5.1)
SODIUM: 137 mmol/L (ref 135–145)

## 2016-03-04 MED ORDER — CALCIUM CARBONATE ANTACID 500 MG PO CHEW: 2.0000 | CHEWABLE_TABLET | Freq: Three times a day (TID) | ORAL | 1 refills | Status: DC

## 2016-03-04 MED ORDER — SODIUM CHLORIDE 0.9 % IV SOLN
2.0000 g | INTRAVENOUS | Status: AC
  Administered 2016-03-04: 2 g via INTRAVENOUS
  Filled 2016-03-04: qty 20

## 2016-03-04 MED ORDER — HYDROCODONE-ACETAMINOPHEN 5-325 MG PO TABS: 1.0000 | ORAL_TABLET | ORAL | 0 refills | Status: DC | PRN

## 2016-03-04 NOTE — Discharge Summary (Signed)
Physician Discharge Summary [facility]  Patient ID: Joanne Hunt MRN: [id number] DOB/AGE: 43-Aug-1972 43 y.o.  Admit date: 03/03/2016 Discharge date: 03/04/2016  Admission Diagnoses:  Graves' disease  Discharge Diagnoses:  Active Problems:   Graves' disease   Discharged Condition: good  Hospital Course: Patient was admitted for observation following thyroid surgery.  Post op course was uncomplicated.  Pain was well controlled.  Tolerated diet.  Post op calcium level on morning following surgery was 7.6 mg/dl.  Patient was given IV calcium gluconate 2 gm prior to discharge.  Patient was prepared for discharge home on POD#1.  Consults: None  Treatments: surgery: total thyroidectomy  Discharge Exam: Blood pressure (!) 112/56, pulse 66, temperature 98 F (36.7 C), temperature source Oral, resp. rate 16, height 5\' 4"  (1.626 m), weight 68.5 kg (151 lb), last menstrual period 02/10/2016, SpO2 98 %. HEENT - clear Neck - wound dry and intact; minimal STS; voice normal Chest - clear bilaterally Cor - RRR   Disposition: Home  Discharge Instructions    Apply dressing    Complete by:  As directed    Apply light gauze dressing to neck before discharge home today.   Diet - low sodium heart healthy    Complete by:  As directed    Increase activity slowly    Complete by:  As directed    Remove dressing in 24 hours    Complete by:  As directed        Medication List    TAKE these medications   ALPRAZolam 0.5 MG tablet Commonly known as:  XANAX Take 0.5 mg by mouth 2 (two) times daily as needed for anxiety.   amphetamine-dextroamphetamine 20 MG tablet Commonly known as:  ADDERALL Take 20 mg by mouth daily as needed (for adhd).   calcium carbonate 500 MG chewable tablet Commonly known as:  TUMS Chew 2 tablets (400 mg of elemental calcium total) by mouth 3 (three) times daily.   HYDROcodone-acetaminophen 5-325 MG tablet Commonly known as:   NORCO/VICODIN Take 1-2 tablets by mouth every 4 (four) hours as needed for moderate pain.   meloxicam 15 MG tablet Commonly known as:  MOBIC Take 1 tablet (15 mg total) by mouth daily.   metoprolol 50 MG tablet Commonly known as:  LOPRESSOR Take 1 tablet (50 mg total) by mouth 2 (two) times daily.      Follow-up Information    Darris Staiger M, MD. Schedule an appointment as soon as possible for a visit in 3 week(s).   Specialty:  General Surgery Contact information: [address] [place] [address] [phone]        Jacelyn Pi, MD. Schedule an appointment as soon as possible for a visit in 3 week(s).   Specialty:  Endocrinology Contact information: [address] [place] [address] [phone]           Earnstine Regal, MD, [facility] Office: [phone]   Signed: Earnstine Regal 03/04/2016, 8:10 AM

## 2016-03-04 NOTE — Progress Notes (Signed)
RN reviewed discharge paperwork with patient. All questions answered.  Patient able to give teach back method. Patient understands when to notify MD of concerns. Patient verbalizes understanding of follow up appointment.   Discharge paperwork and prescriptions given to patient.   NT rolled patient out to family car.

## 2016-03-04 NOTE — Progress Notes (Signed)
Joanne Hunt - Please contact patient and notify of benign pathology results.  Joanne Regal, MD, [facility] Office: [phone]

## 2016-03-04 NOTE — Discharge Instructions (Signed)
[facility] ° °THYROID & PARATHYROID SURGERY:  POST-OP INSTRUCTIONS ° °Always review your discharge instruction sheet from the facility where your surgery was performed. ° °A prescription for pain medication may be given to you upon discharge.  Take your pain medication as prescribed.  If narcotic pain medicine is not needed, then you may take acetaminophen (Tylenol) or ibuprofen (Advil) as needed. ° °Take your usually prescribed medications unless otherwise directed. ° °If you need a refill on your pain medication, please contact your pharmacy. They will contact our office to request authorization.  Prescriptions will not be processed by our office after 5 pm or on weekends. ° °Start with a light diet upon arrival home, such as soup and crackers or toast.  Be sure to drink plenty of fluids daily.  Resume your normal diet the day after surgery. ° °Most patients will experience some swelling and bruising on the chest and neck area.  Ice packs will help.  Swelling and bruising can take several days to resolve.  ° °It is common to experience some constipation after surgery.  Increasing fluid intake and taking a stool softener will usually help or prevent this problem.  A mild laxative (Milk of Magnesia or Miralax) should be taken according to package directions if there has been no bowel movement after 48 hours. ° °You have steri-strips and a gauze dressing over your incision.  You may remove the gauze bandage on the second day after surgery, and you may shower at that time.  Leave your steri-strips (small skin tapes) in place directly over the incision.  These strips should remain on the skin for 5-7 days and then be removed.  You may get them wet in the shower and pat them dry. ° °You may resume regular (light) daily activities beginning the next day - such as daily self-care, walking, climbing stairs - gradually increasing activities as tolerated.  You may have sexual intercourse when it is  comfortable.  Refrain from any heavy lifting or straining until approved by your doctor.  You may drive when you no longer are taking prescription pain medication, you can comfortably wear a seatbelt, and you can safely maneuver your car and apply brakes. ° °You should see your doctor in the office for a follow-up appointment approximately two to three weeks after your surgery.  Make sure that you call for this appointment within a day or two after you arrive home to insure a convenient appointment time. ° °WHEN TO CALL YOUR DOCTOR: °-- Fever greater than 101.5 °-- Inability to urinate °-- Nausea and/or vomiting - persistent °-- Extreme swelling or bruising °-- Continued bleeding from incision °-- Increased pain, redness, or drainage from the incision °-- Difficulty swallowing or breathing °-- Muscle cramping or spasms °-- Numbness or tingling in hands or around lips ° °The clinic staff is available to answer your questions during regular business hours.  Please don’t hesitate to call and ask to speak to one of the nurses if you have concerns. ° °Lurine Imel M. Keah Lamba, MD, FACS °General & Endocrine Surgery °[facility] °Office: [phone] ° °Website: [email or web address] ° ° °

## 2016-03-04 NOTE — Care Management Note (Signed)
Case Management Note  Patient Details  Name: Joanne Hunt MRN: [id number] Date of Birth: [birth year]  Subjective/Objective:  45 y/o f admitted w/Graves Disease. From home.                  Action/Plan:d/c home no needs or orders.   Expected Discharge Date:                  Expected Discharge Plan:  Home/Self Care  In-House Referral:     Discharge planning Services  CM Consult  Post Acute Care Choice:    Choice offered to:     DME Arranged:    DME Agency:     HH Arranged:    [place] Agency:     Status of Service:  Completed, signed off  If discussed at [employer] of Stay Meetings, dates discussed:    Additional Comments:  Dessa Phi, RN 03/04/2016, 9:49 AM

## 2016-03-05 ENCOUNTER — Other Ambulatory Visit (HOSPITAL_COMMUNITY)
Admission: RE | Admit: 2016-03-05 | Discharge: 2016-03-05 | Disposition: A | Discharge status: Home / Self Care | Payer: BLUE CROSS/BLUE SHIELD | Source: Ambulatory Visit | Attending: Surgery | Admitting: Surgery

## 2016-03-05 DIAGNOSIS — E890 Postprocedural hypothyroidism: Secondary | ICD-10-CM | POA: Diagnosis not present

## 2016-03-05 LAB — CALCIUM: Calcium: 7.8 mg/dL — ABNORMAL LOW (ref 8.9–10.3)

## 2016-03-06 ENCOUNTER — Emergency Department (HOSPITAL_COMMUNITY)
Admission: EM | Admit: 2016-03-06 | Discharge: 2016-03-06 | Disposition: A | Discharge status: Home / Self Care | Payer: BLUE CROSS/BLUE SHIELD | Attending: Emergency Medicine | Admitting: Emergency Medicine

## 2016-03-06 ENCOUNTER — Encounter (HOSPITAL_COMMUNITY): Payer: Self-pay | Admitting: Emergency Medicine

## 2016-03-06 DIAGNOSIS — Z79899 Other long term (current) drug therapy: Secondary | ICD-10-CM | POA: Insufficient documentation

## 2016-03-06 DIAGNOSIS — R200 Anesthesia of skin: Secondary | ICD-10-CM | POA: Diagnosis present

## 2016-03-06 LAB — CBC WITH DIFFERENTIAL/PLATELET
BASOS PCT: 0 %
Basophils Absolute: 0 10*3/uL (ref 0.0–0.1)
EOS PCT: 5 %
Eosinophils Absolute: 0.3 10*3/uL (ref 0.0–0.7)
HEMATOCRIT: 38.8 % (ref 36.0–46.0)
Hemoglobin: 13.1 g/dL (ref 12.0–15.0)
LYMPHS PCT: 34 %
Lymphs Abs: 1.9 10*3/uL (ref 0.7–4.0)
MCH: 27.7 pg (ref 26.0–34.0)
MCHC: 33.8 g/dL (ref 30.0–36.0)
MCV: 82 fL (ref 78.0–100.0)
MONO ABS: 0.6 10*3/uL (ref 0.1–1.0)
MONOS PCT: 10 %
NEUTROS ABS: 2.8 10*3/uL (ref 1.7–7.7)
Neutrophils Relative %: 51 %
Platelets: 291 10*3/uL (ref 150–400)
RBC: 4.73 MIL/uL (ref 3.87–5.11)
RDW: 14.1 % (ref 11.5–15.5)
WBC: 5.6 10*3/uL (ref 4.0–10.5)

## 2016-03-06 LAB — COMPREHENSIVE METABOLIC PANEL
ALT: 33 U/L (ref 14–54)
ANION GAP: 8 (ref 5–15)
AST: 32 U/L (ref 15–41)
Albumin: 3.6 g/dL (ref 3.5–5.0)
Alkaline Phosphatase: 109 U/L (ref 38–126)
BILIRUBIN TOTAL: 0.6 mg/dL (ref 0.3–1.2)
BUN: 9 mg/dL (ref 6–20)
CO2: 27 mmol/L (ref 22–32)
Calcium: 8.1 mg/dL — ABNORMAL LOW (ref 8.9–10.3)
Chloride: 103 mmol/L (ref 101–111)
Creatinine, Ser: 0.52 mg/dL (ref 0.44–1.00)
GFR calc Af Amer: >60 mL/min (ref >60)
Glucose, Bld: 98 mg/dL (ref 65–99)
POTASSIUM: 3.6 mmol/L (ref 3.5–5.1)
Sodium: 138 mmol/L (ref 135–145)
TOTAL PROTEIN: 6.8 g/dL (ref 6.5–8.1)

## 2016-03-06 MED ORDER — SODIUM CHLORIDE 0.9 % IV SOLN
1.0000 g | Freq: Once | INTRAVENOUS | Status: AC
  Administered 2016-03-06: 1 g via INTRAVENOUS
  Filled 2016-03-06: qty 10

## 2016-03-06 MED ORDER — SODIUM CHLORIDE 0.9 % IV SOLN
Freq: Once | INTRAVENOUS | Status: AC
  Administered 2016-03-06: 03:00:00 via INTRAVENOUS

## 2016-03-06 NOTE — ED Triage Notes (Addendum)
Pt from home via EMS with complaints of tetany and low calcium (7.8 this morning) Pt had her thyroid removed Monday. Pt also has numbness in her face and on the bottom of her right foot. Pt's surgeon told her to increase her calcium pill intake from 2 pills 3 times a day to 3 pills 4 times a day.

## 2016-03-06 NOTE — ED Notes (Signed)
Bed: [id number] Expected date:  Expected time:  Means of arrival:  Comments: EMS-25yoF postop problems

## 2016-03-06 NOTE — ED Provider Notes (Signed)
[place] DEPT Provider Note   CSN: [id number] Arrival date & time: 03/06/16  0106  By signing my name below, I, Dora Sims, attest that this documentation has been prepared under the direction and in the presence of physician practitioner, Orpah Greek, MD. Electronically Signed: Dora Sims, Scribe. 03/06/2016. 1:39 AM.  History   Chief Complaint Chief Complaint  Patient presents with  . critical lab  . Numbness    The history is provided by the patient. No language interpreter was used.     HPI Comments: Joanne Hunt is a 45 y.o. female brought in by EMS who presents to the Emergency Department complaining of muscle spasm of her right hand beginning several hours ago. Pt states she had her thyroid removed 3 days ago but was not informed that muscle spasms were a potential post-op issue. She has had some regular tingling in her hands, feet, and around her mouth since the surgery as well. She states her right hand spasm is the only one she has experienced since the thyroidectomy. She became concerned for stroke when her right hand spasm presented and called EMS. Pt reports her calcium has been low since the surgery and was measured at 7.8 yesterday morning. Pt increased her calcium pill intake yesterday per recommendation by her surgeon, and has also taken Tums since the spasm tonight. She denies weakness, fever, chills, color change, wound, or any other associated symptoms.   Past Medical History:  Diagnosis Date  . Adopted    "patient is adopted"  . Anxiety   . Dysrhythmia    tachycardia  . Thyroid disease    Graves disease    Patient Active Problem List   Diagnosis Date Noted  . Graves' disease 02/12/2016    Past Surgical History:  Procedure Laterality Date  . THYROIDECTOMY N/A 03/03/2016   Procedure: TOTAL THYROIDECTOMY;  Surgeon: Armandina Gemma, MD;  Location: [facility];  Service: General;  Laterality: N/A;  . WISDOM TOOTH EXTRACTION      OB History     No data available       Home Medications    Prior to Admission medications   Medication Sig Start Date End Date Taking? Authorizing Provider  calcium carbonate (TUMS) 500 MG chewable tablet Chew 2 tablets (400 mg of elemental calcium total) by mouth 3 (three) times daily. 03/04/16  Yes Armandina Gemma, MD  HYDROcodone-acetaminophen (NORCO/VICODIN) 5-325 MG tablet Take 1-2 tablets by mouth every 4 (four) hours as needed for moderate pain. 03/04/16  Yes Armandina Gemma, MD  metoprolol (LOPRESSOR) 50 MG tablet Take 1 tablet (50 mg total) by mouth 2 (two) times daily. 02/13/16  Yes Leo Grosser, MD  meloxicam (MOBIC) 15 MG tablet Take 1 tablet (15 mg total) by mouth daily. Patient not taking: Reported on 03/06/2016 12/11/15   Virgel Manifold, MD    Family History No family history on file.  Social History Social History  Substance Use Topics  . Smoking status: Never Smoker  . Smokeless tobacco: Never Used  . Alcohol use No     Allergies   Ptu [propylthiouracil] and Codeine   Review of Systems Review of Systems  Constitutional: Negative for chills and fever.  Musculoskeletal: Positive for myalgias (muscle spasm of right hand).  Skin: Negative for color change and wound.  Neurological: Positive for numbness. Negative for weakness.  All other systems reviewed and are negative.   Physical Exam Updated Vital Signs BP 151/74 (BP Location: Left Arm)   Pulse 70   Temp  98.2 F (36.8 C) (Oral)   Resp 14   LMP 02/10/2016 (Exact Date)   SpO2 97%   Physical Exam  Constitutional: She is oriented to person, place, and time. She appears well-developed and well-nourished. No distress.  HENT:  Head: Normocephalic and atraumatic.  Right Ear: Hearing normal.  Left Ear: Hearing normal.  Nose: Nose normal.  Mouth/Throat: Oropharynx is clear and moist and mucous membranes are normal.  Eyes: Conjunctivae and EOM are normal. Pupils are equal, round, and reactive to light.  Neck: Normal range  of motion. Neck supple.  Cardiovascular: Regular rhythm, S1 normal and S2 normal.  Exam reveals no gallop and no friction rub.   No murmur heard. Pulmonary/Chest: Effort normal and breath sounds normal. No respiratory distress. She exhibits no tenderness.  Abdominal: Soft. Normal appearance and bowel sounds are normal. There is no hepatosplenomegaly. There is no tenderness. There is no rebound, no guarding, no tenderness at McBurney's point and negative Murphy's sign. No hernia.  Musculoskeletal: Normal range of motion.  Neurological: She is alert and oriented to person, place, and time. She has normal strength. No cranial nerve deficit or sensory deficit. Coordination normal. GCS eye subscore is 4. GCS verbal subscore is 5. GCS motor subscore is 6.  Skin: Skin is warm, dry and intact. No rash noted. No cyanosis.  Psychiatric: She has a normal mood and affect. Her speech is normal and behavior is normal. Thought content normal.  Nursing note and vitals reviewed.   ED Treatments / Results  Labs (all labs ordered are listed, but only abnormal results are displayed) Labs Reviewed  COMPREHENSIVE METABOLIC PANEL - Abnormal; Notable for the following:       Result Value   Calcium 8.1 (*)    All other components within normal limits  CBC WITH DIFFERENTIAL/PLATELET  CALCIUM, IONIZED    EKG  EKG Interpretation None       Radiology No results found.  Procedures Procedures (including critical care time)  DIAGNOSTIC STUDIES: Oxygen Saturation is 97% on RA, normal by my interpretation.    COORDINATION OF CARE: 1:45 AM Discussed treatment plan with pt at bedside and pt agreed to plan.  Medications Ordered in ED Medications  0.9 %  sodium chloride infusion ( Intravenous New Bag/Given 03/06/16 0249)  calcium gluconate 1 g in sodium chloride 0.9 % 100 mL IVPB (1 g Intravenous New Bag/Given 03/06/16 0246)     Initial Impression / Assessment and Plan / ED Course  I have reviewed the  triage vital signs and the nursing notes.  Pertinent labs & imaging results that were available during my care of the patient were reviewed by me and considered in my medical decision making (see chart for details).  Clinical Course    Patient with recent thyroidectomy presents to the ER with complaints of tingling of the hands, feet and around her mouth intermittently followed by spasms of her upper extremities tonight. This was consistent with carpal spasm secondary to her hypokalemia. Patient's calcium was 8.1 today, actually slightly increased from the other day. She is responding to her increased oral calcium. Patient given IV calcium here in the ER and has had resolution of the tingling symptoms. She is appropriate for discharge and continued outpatient calcium replacement. .  Final Clinical Impressions(s) / ED Diagnoses   Final diagnoses:  Hypocalcemia    New Prescriptions New Prescriptions   No medications on file     Orpah Greek, MD 03/06/16 [phone]

## 2016-03-06 NOTE — ED Notes (Signed)
MD at bedside. 

## 2016-03-07 LAB — CALCIUM, IONIZED: Calcium, Ionized, Serum: 4.5 mg/dL (ref 4.5–5.6)

## 2016-05-21 DIAGNOSIS — E890 Postprocedural hypothyroidism: Secondary | ICD-10-CM | POA: Diagnosis not present

## 2016-06-23 DIAGNOSIS — E890 Postprocedural hypothyroidism: Secondary | ICD-10-CM | POA: Diagnosis not present

## 2016-07-14 DIAGNOSIS — L650 Telogen effluvium: Secondary | ICD-10-CM | POA: Diagnosis not present

## 2016-08-20 DIAGNOSIS — L658 Other specified nonscarring hair loss: Secondary | ICD-10-CM | POA: Diagnosis not present

## 2016-09-04 DIAGNOSIS — N6321 Unspecified lump in the left breast, upper outer quadrant: Secondary | ICD-10-CM | POA: Diagnosis not present

## 2016-09-04 DIAGNOSIS — N6320 Unspecified lump in the left breast, unspecified quadrant: Secondary | ICD-10-CM | POA: Diagnosis not present

## 2016-10-14 DIAGNOSIS — L650 Telogen effluvium: Secondary | ICD-10-CM | POA: Diagnosis not present

## 2016-10-17 DIAGNOSIS — D509 Iron deficiency anemia, unspecified: Secondary | ICD-10-CM | POA: Diagnosis not present

## 2016-10-17 DIAGNOSIS — L658 Other specified nonscarring hair loss: Secondary | ICD-10-CM | POA: Diagnosis not present

## 2016-10-30 DIAGNOSIS — E890 Postprocedural hypothyroidism: Secondary | ICD-10-CM | POA: Diagnosis not present

## 2016-11-27 DIAGNOSIS — M47816 Spondylosis without myelopathy or radiculopathy, lumbar region: Secondary | ICD-10-CM | POA: Diagnosis not present

## 2016-11-27 DIAGNOSIS — M25552 Pain in left hip: Secondary | ICD-10-CM | POA: Diagnosis not present

## 2016-11-27 DIAGNOSIS — R7301 Impaired fasting glucose: Secondary | ICD-10-CM | POA: Diagnosis not present

## 2016-11-27 DIAGNOSIS — M545 Low back pain: Secondary | ICD-10-CM | POA: Diagnosis not present

## 2016-11-27 DIAGNOSIS — N390 Urinary tract infection, site not specified: Secondary | ICD-10-CM | POA: Diagnosis not present

## 2016-11-27 DIAGNOSIS — D509 Iron deficiency anemia, unspecified: Secondary | ICD-10-CM | POA: Diagnosis not present

## 2017-01-02 DIAGNOSIS — B379 Candidiasis, unspecified: Secondary | ICD-10-CM | POA: Diagnosis not present

## 2017-01-02 DIAGNOSIS — N3000 Acute cystitis without hematuria: Secondary | ICD-10-CM | POA: Diagnosis not present

## 2017-01-02 DIAGNOSIS — T3695XA Adverse effect of unspecified systemic antibiotic, initial encounter: Secondary | ICD-10-CM | POA: Diagnosis not present

## 2017-01-20 DIAGNOSIS — R399 Unspecified symptoms and signs involving the genitourinary system: Secondary | ICD-10-CM | POA: Diagnosis not present

## 2017-01-20 DIAGNOSIS — E890 Postprocedural hypothyroidism: Secondary | ICD-10-CM | POA: Diagnosis not present

## 2017-01-20 DIAGNOSIS — D509 Iron deficiency anemia, unspecified: Secondary | ICD-10-CM | POA: Diagnosis not present

## 2017-02-10 DIAGNOSIS — N926 Irregular menstruation, unspecified: Secondary | ICD-10-CM | POA: Diagnosis not present

## 2017-02-10 DIAGNOSIS — Z01419 Encounter for gynecological examination (general) (routine) without abnormal findings: Secondary | ICD-10-CM | POA: Diagnosis not present

## 2017-03-10 DIAGNOSIS — E890 Postprocedural hypothyroidism: Secondary | ICD-10-CM | POA: Diagnosis not present

## 2017-03-10 DIAGNOSIS — D509 Iron deficiency anemia, unspecified: Secondary | ICD-10-CM | POA: Diagnosis not present

## 2017-04-20 DIAGNOSIS — Z79899 Other long term (current) drug therapy: Secondary | ICD-10-CM | POA: Diagnosis not present

## 2017-04-20 DIAGNOSIS — L650 Telogen effluvium: Secondary | ICD-10-CM | POA: Diagnosis not present

## 2017-04-22 DIAGNOSIS — E782 Mixed hyperlipidemia: Secondary | ICD-10-CM | POA: Diagnosis not present

## 2017-04-22 DIAGNOSIS — L650 Telogen effluvium: Secondary | ICD-10-CM | POA: Diagnosis not present

## 2017-05-20 DIAGNOSIS — R6889 Other general symptoms and signs: Secondary | ICD-10-CM | POA: Diagnosis not present

## 2017-06-25 DIAGNOSIS — N760 Acute vaginitis: Secondary | ICD-10-CM | POA: Diagnosis not present

## 2017-06-26 DIAGNOSIS — N760 Acute vaginitis: Secondary | ICD-10-CM | POA: Diagnosis not present

## 2017-07-22 ENCOUNTER — Other Ambulatory Visit: Payer: Self-pay

## 2017-07-22 ENCOUNTER — Emergency Department (HOSPITAL_COMMUNITY)
Admission: EM | Admit: 2017-07-22 | Discharge: 2017-07-22 | Disposition: A | Discharge status: Home / Self Care | Payer: Self-pay | Attending: Physician Assistant | Admitting: Physician Assistant

## 2017-07-22 ENCOUNTER — Emergency Department (HOSPITAL_COMMUNITY): Payer: Self-pay

## 2017-07-22 ENCOUNTER — Encounter (HOSPITAL_COMMUNITY): Payer: Self-pay

## 2017-07-22 DIAGNOSIS — R0602 Shortness of breath: Secondary | ICD-10-CM | POA: Insufficient documentation

## 2017-07-22 DIAGNOSIS — R0789 Other chest pain: Secondary | ICD-10-CM

## 2017-07-22 DIAGNOSIS — R51 Headache: Secondary | ICD-10-CM | POA: Insufficient documentation

## 2017-07-22 DIAGNOSIS — R519 Headache, unspecified: Secondary | ICD-10-CM

## 2017-07-22 DIAGNOSIS — R110 Nausea: Secondary | ICD-10-CM | POA: Insufficient documentation

## 2017-07-22 DIAGNOSIS — Z79899 Other long term (current) drug therapy: Secondary | ICD-10-CM | POA: Insufficient documentation

## 2017-07-22 LAB — CBC
HCT: 42.4 % (ref 36.0–46.0)
HEMOGLOBIN: 14.5 g/dL (ref 12.0–15.0)
MCH: 31.9 pg (ref 26.0–34.0)
MCHC: 34.2 g/dL (ref 30.0–36.0)
MCV: 93.4 fL (ref 78.0–100.0)
Platelets: 333 10*3/uL (ref 150–400)
RBC: 4.54 MIL/uL (ref 3.87–5.11)
RDW: 12.6 % (ref 11.5–15.5)
WBC: 5.6 10*3/uL (ref 4.0–10.5)

## 2017-07-22 LAB — BASIC METABOLIC PANEL
ANION GAP: 11 (ref 5–15)
BUN: 11 mg/dL (ref 6–20)
CALCIUM: 9.1 mg/dL (ref 8.9–10.3)
CO2: 24 mmol/L (ref 22–32)
Chloride: 105 mmol/L (ref 101–111)
Creatinine, Ser: 0.82 mg/dL (ref 0.44–1.00)
Glucose, Bld: 94 mg/dL (ref 65–99)
Potassium: 3.5 mmol/L (ref 3.5–5.1)
SODIUM: 140 mmol/L (ref 135–145)

## 2017-07-22 LAB — I-STAT BETA HCG BLOOD, ED (MC, WL, AP ONLY)

## 2017-07-22 LAB — I-STAT TROPONIN, ED: TROPONIN I, POC: 0 ng/mL (ref 0.00–0.08)

## 2017-07-22 NOTE — Discharge Instructions (Signed)
Please follow up closely with your primary care provider for further evaluation of your condition.  Return if you notice vision changes, confusion, weakness in your arm/leg, fever, or if you have other concerns.

## 2017-07-22 NOTE — ED Provider Notes (Signed)
[place] EMERGENCY DEPARTMENT Provider Note   CSN: [id number] Arrival date & time: 07/22/17  1509     History   Chief Complaint Chief Complaint  Patient presents with  . Chest Pain  . Shortness of Breath    HPI Joanne Hunt is a 47 y.o. female.  HPI   47 year old female with history of Graves' disease presenting for evaluation of headache and shortness of breath.  Patient report for the past 2 weeks she has had recurrent left-sided headache.  Describes headache as a throbbing sensation sometimes behind the eye, headache tends to increase with stress.  Sometimes she was experiencing chest pressure and shortness of breath with these episodes.  It has been waxing waning, usually improves with Advil.  This morning her symptoms still persist and she endorsed some nausea which concerns her.  She admits to having history of anxiety.  She also mentioned a recent change in her job which she is trying to cope with.  She denies any exertional chest pain and no prior history of exertional syncope.  She denies any alcohol or drug use.  No prior cardiac disease.  Does have history of thyroid disease currently taking Synthroid.  She has been compliant with her medication.  She denies SI or HI.  Symptom is mostly resolved while waiting in the ER.  Past Medical History:  Diagnosis Date  . Adopted    "patient is adopted"  . Anxiety   . Dysrhythmia    tachycardia  . Thyroid disease    Graves disease    Patient Active Problem List   Diagnosis Date Noted  . Graves' disease 02/12/2016    Past Surgical History:  Procedure Laterality Date  . THYROIDECTOMY N/A 03/03/2016   Procedure: TOTAL THYROIDECTOMY;  Surgeon: Armandina Gemma, MD;  Location: [facility];  Service: General;  Laterality: N/A;  . WISDOM TOOTH EXTRACTION      OB History    No data available       Home Medications    Prior to Admission medications   Medication Sig Start Date End Date Taking? Authorizing  Provider  calcium carbonate (TUMS) 500 MG chewable tablet Chew 2 tablets (400 mg of elemental calcium total) by mouth 3 (three) times daily. 03/04/16   Armandina Gemma, MD  HYDROcodone-acetaminophen (NORCO/VICODIN) 5-325 MG tablet Take 1-2 tablets by mouth every 4 (four) hours as needed for moderate pain. 03/04/16   Armandina Gemma, MD  meloxicam (MOBIC) 15 MG tablet Take 1 tablet (15 mg total) by mouth daily. Patient not taking: Reported on 03/06/2016 12/11/15   Virgel Manifold, MD  metoprolol (LOPRESSOR) 50 MG tablet Take 1 tablet (50 mg total) by mouth 2 (two) times daily. 02/13/16   Leo Grosser, MD    Family History History reviewed. No pertinent family history.  Social History Social History   Tobacco Use  . Smoking status: Never Smoker  . Smokeless tobacco: Never Used  Substance Use Topics  . Alcohol use: No  . Drug use: No     Allergies   Ptu [propylthiouracil] and Codeine   Review of Systems Review of Systems  All other systems reviewed and are negative.    Physical Exam Updated Vital Signs BP (!) 155/97 (BP Location: Right Arm)   Pulse 87   Temp 98.2 F (36.8 C) (Oral)   Resp 18   Ht 5\' 4"  (1.626 m)   Wt 62.1 kg (137 lb)   LMP 07/01/2017 (Exact Date)   SpO2 100%   BMI  23.52 kg/m   Physical Exam  Constitutional: She is oriented to person, place, and time. She appears well-developed and well-nourished. No distress.  HENT:  Head: Atraumatic.  Eyes: Conjunctivae are normal.  Neck: Neck supple. No JVD present. No tracheal deviation present. No thyromegaly present.  No nuchal rigidity  Cardiovascular: Normal rate, regular rhythm, intact distal pulses and normal pulses. Exam reveals no gallop.  No murmur heard. Pulmonary/Chest: Effort normal and breath sounds normal. She has no decreased breath sounds. She has no wheezes. She has no rales.  Abdominal: Soft. She exhibits no mass. There is no tenderness. There is no guarding.  Musculoskeletal: Normal range of  motion.       Right lower leg: She exhibits no edema.       Left lower leg: She exhibits no edema.  Neurological: She is alert and oriented to person, place, and time.  Neurologic exam:  Speech clear, pupils equal round reactive to light, extraocular movements intact  Normal peripheral visual fields Cranial nerves III through XII normal including no facial droop Follows commands, moves all extremities x4, normal strength to bilateral upper and lower extremities at all major muscle groups including grip Sensation normal to light touch Coordination intact, no limb ataxia, finger-nose-finger normal Rapid alternating movements normal No pronator drift Gait normal   Skin: Capillary refill takes less than 2 seconds. No rash noted.  Psychiatric: She has a normal mood and affect.  Nursing note and vitals reviewed.    ED Treatments / Results  Labs (all labs ordered are listed, but only abnormal results are displayed) Labs Reviewed  BASIC METABOLIC PANEL  CBC  I-STAT TROPONIN, ED  I-STAT BETA HCG BLOOD, ED ([facility], [facility], [facility] ONLY)    EKG  EKG Interpretation None      Date: 07/22/2017  Rate: 80  Rhythm: normal sinus rhythm  QRS Axis: normal  Intervals: normal  ST/T Wave abnormalities: normal  Conduction Disutrbances: none  Narrative Interpretation:   Old EKG Reviewed: No significant changes noted     Radiology Dg Chest 2 View  Result Date: 07/22/2017 CLINICAL DATA:  47 year old female with intermittent shortness of breath and headache EXAM: CHEST - 2 VIEW COMPARISON:  Prior chest x-ray 03/03/2016 FINDINGS: The lungs are clear and negative for focal airspace consolidation, pulmonary edema or suspicious pulmonary nodule. No pleural effusion or pneumothorax. Cardiac and mediastinal contours are within normal limits. No acute fracture or lytic or blastic osseous lesions. The visualized upper abdominal bowel gas pattern is unremarkable. IMPRESSION: Negative chest x-ray. Electronically  Signed   By: Jacqulynn Cadet M.D.   On: 07/22/2017 17:50    Procedures Procedures (including critical care time)  Medications Ordered in ED Medications - No data to display   Initial Impression / Assessment and Plan / ED Course  I have reviewed the triage vital signs and the nursing notes.  Pertinent labs & imaging results that were available during my care of the patient were reviewed by me and considered in my medical decision making (see chart for details).     BP (!) 170/100 (BP Location: Left Arm)   Pulse 79   Temp 98.3 F (36.8 C) (Oral)   Resp 16   Ht 5\' 4"  (1.626 m)   Wt 62.1 kg (137 lb)   LMP 07/01/2017 (Exact Date)   SpO2 100%   BMI 23.52 kg/m  The patient was noted to be hypertensive today in the emergency department. I have spoken with the patient regarding hypertension and the  need for improved management. I instructed the patient to followup with the Primary care doctor within 4 days to improve the management of the patient's hypertension. I also counseled the patient regarding the signs and symptoms which would require an emergent visit to an emergency department for hypertensive urgency and/or hypertensive emergency. The patient understood the need for improved hypertensive management.   Final Clinical Impressions(s) / ED Diagnoses   Final diagnoses:  Atypical chest pain  Recurrent headache    ED Discharge Orders    None     7:57 PM Patient here with recurrent left-sided headache.  No significant headache at this time.  No acute onset thunderclap headache concerning for subarachnoid hemorrhage, no fever or nuchal rigidity concerning for meningitis, no focal neuro deficit concerning for stroke or space-occupying lesion.  Suspect headache is related to stress.  No red flags such as IV drug use or active cancer.  Also report chest pain shortness of breath that is brought on by anxiety.  No active pain or shortness of breath currently.  EKG, troponin, chest  x-ray, labs are reassuring.  At this time, I encourage patient to follow-up with primary care provider for further evaluation.  She may needs to have a thyroid level reexam.  Return precautions discussed.   Domenic Moras, PA-C 07/22/17 2043    Macarthur Critchley, MD 07/22/17 [phone]

## 2017-07-22 NOTE — ED Notes (Signed)
b

## 2017-07-22 NOTE — ED Notes (Signed)
Pain reports mild headache 5/10 onset today with mild nausea , no emesis or photophobia .

## 2017-07-22 NOTE — ED Triage Notes (Signed)
Pt endorses intermittent shob and headache x 2 weeks, yesterday pt had left sided chest pain and left shoulder pain, today pt got sick on her stomach. VSS

## 2017-07-23 DIAGNOSIS — Z566 Other physical and mental strain related to work: Secondary | ICD-10-CM | POA: Diagnosis not present

## 2017-07-23 DIAGNOSIS — E890 Postprocedural hypothyroidism: Secondary | ICD-10-CM | POA: Diagnosis not present

## 2017-07-23 DIAGNOSIS — D509 Iron deficiency anemia, unspecified: Secondary | ICD-10-CM | POA: Diagnosis not present

## 2017-07-23 DIAGNOSIS — R030 Elevated blood-pressure reading, without diagnosis of hypertension: Secondary | ICD-10-CM | POA: Diagnosis not present

## 2017-07-27 DIAGNOSIS — R21 Rash and other nonspecific skin eruption: Secondary | ICD-10-CM | POA: Diagnosis not present

## 2017-07-29 DIAGNOSIS — Z0100 Encounter for examination of eyes and vision without abnormal findings: Secondary | ICD-10-CM | POA: Diagnosis not present

## 2017-08-20 DIAGNOSIS — R030 Elevated blood-pressure reading, without diagnosis of hypertension: Secondary | ICD-10-CM | POA: Diagnosis not present

## 2017-08-20 DIAGNOSIS — Z6824 Body mass index (BMI) 24.0-24.9, adult: Secondary | ICD-10-CM | POA: Diagnosis not present

## 2017-08-28 IMAGING — CT CT ANGIO CHEST
2 of 6 series · 18 of 36 positions shown · IV contrast (Omni 300)
Comparison: Chest radiograph December 11, 2015

CLINICAL DATA: Shortness of breath and tachycardia. Elevated
D-dimer.

EXAM:
CT ANGIOGRAPHY CHEST WITH CONTRAST
TECHNIQUE: Multidetector CT imaging of the chest was performed using the
standard protocol during bolus administration of intravenous
contrast. Multiplanar CT image reconstructions and MIPs were
obtained to evaluate the vascular anatomy.
CONTRAST:  100 mL Isovue 370 nonionic

[Series 6: pe thins · axial · 0.53mm/px · z∈[-364,-102]mm · 17 of 296 slices shown]
[im 17/296  lung]
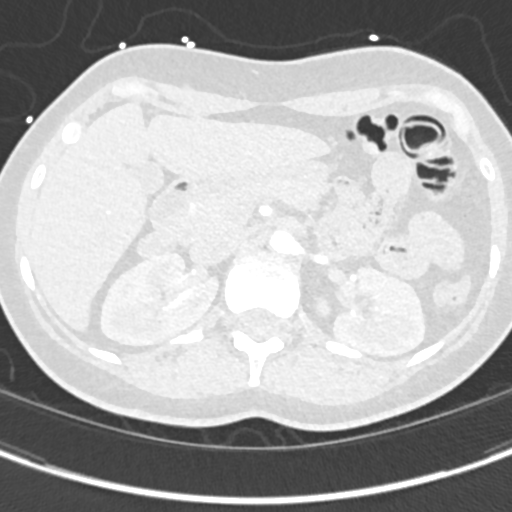
[im 33/296  mediastinal]
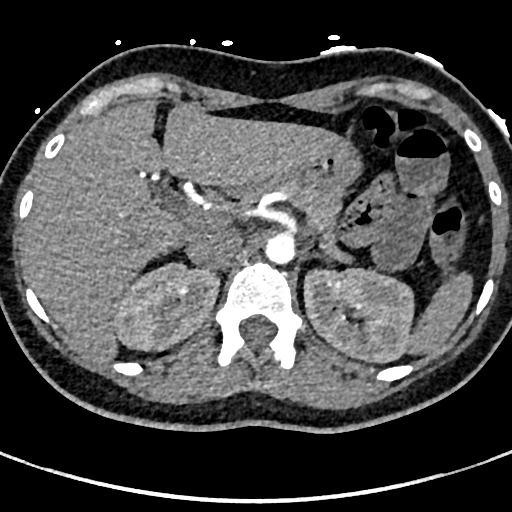
[im 50/296  lung]
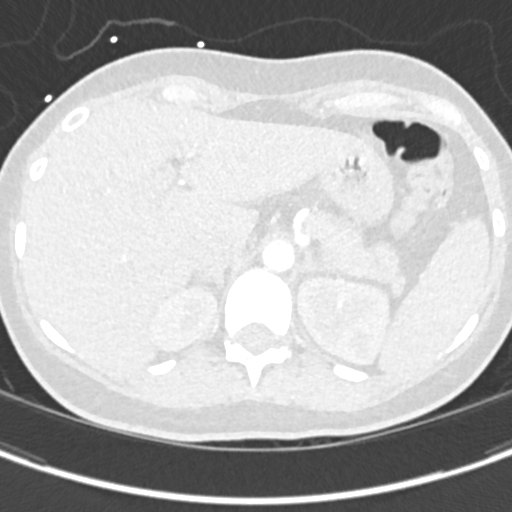
[im 66/296  mediastinal]
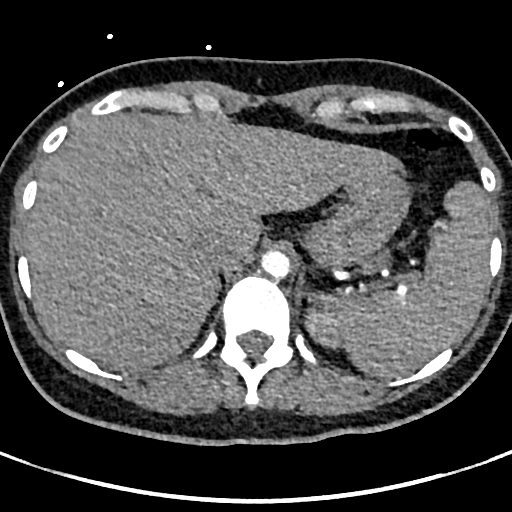
[im 82/296  lung]
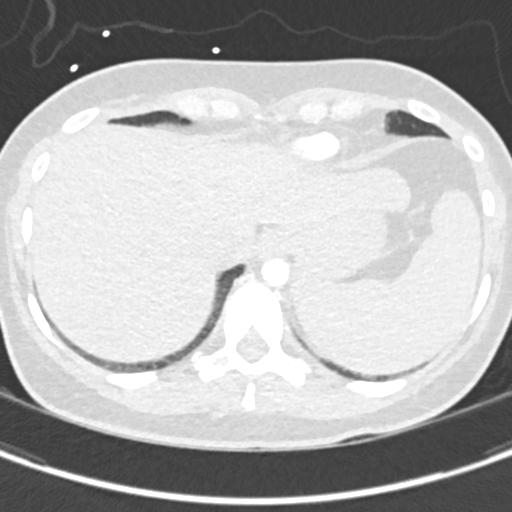
[im 99/296  mediastinal]
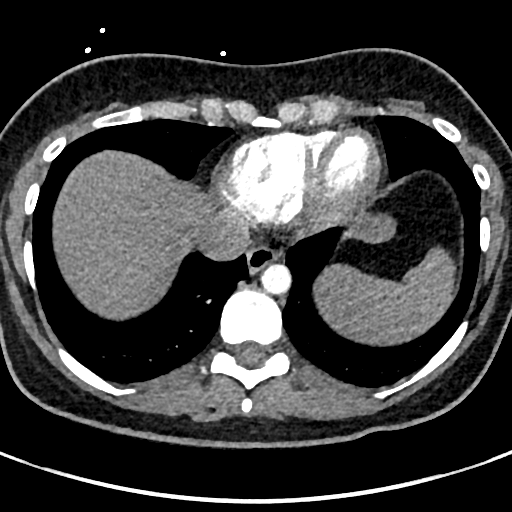
[im 115/296  lung]
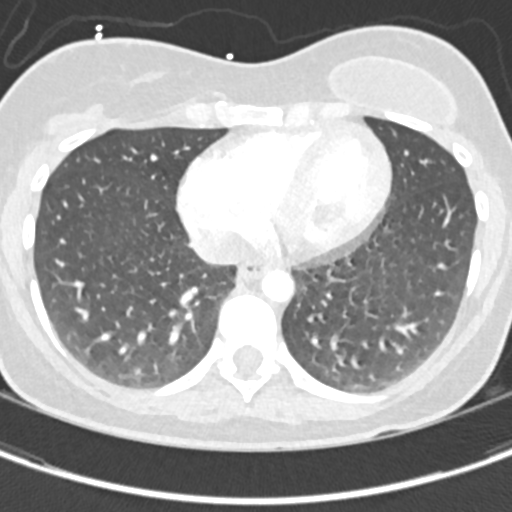
[im 132/296  mediastinal]
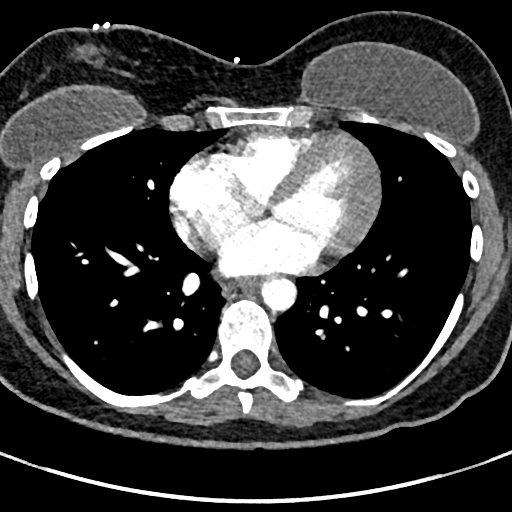
[im 148/296  lung]
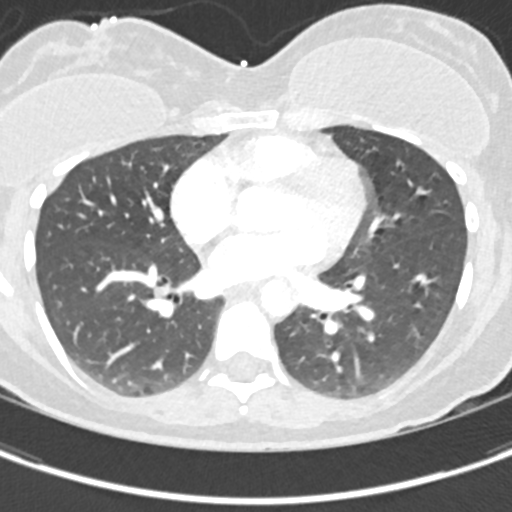
[im 164/296  mediastinal]
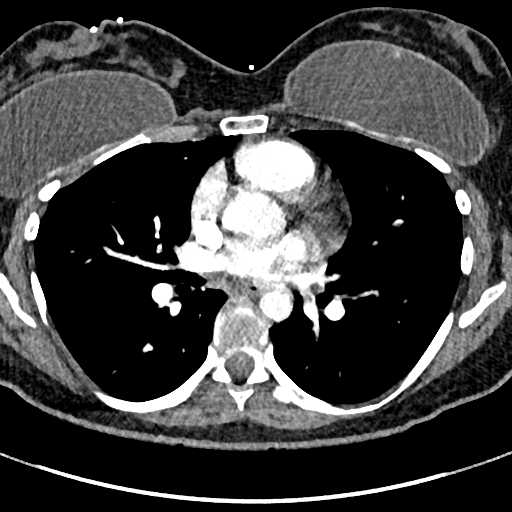
[im 181/296  lung]
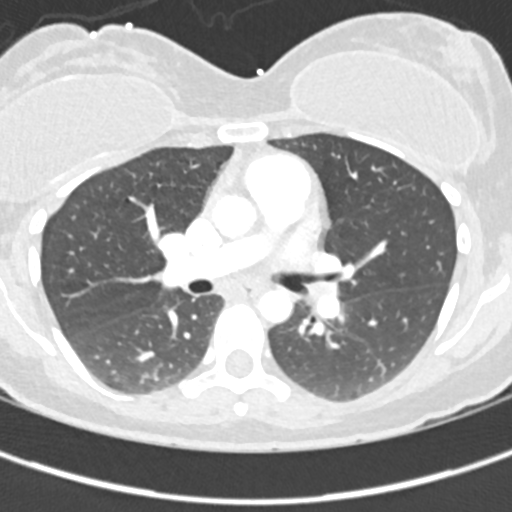
[im 197/296  mediastinal]
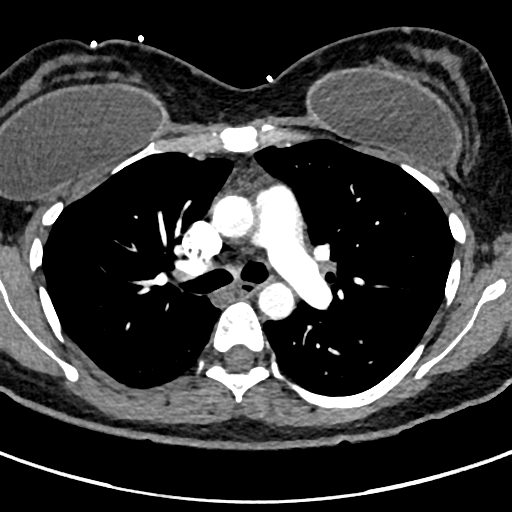
[im 214/296  lung]
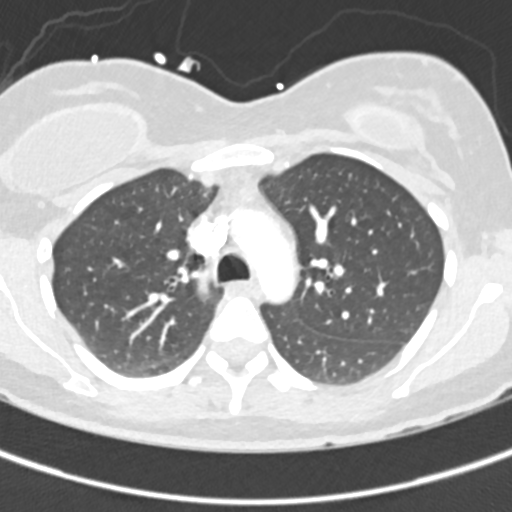
[im 230/296  mediastinal]
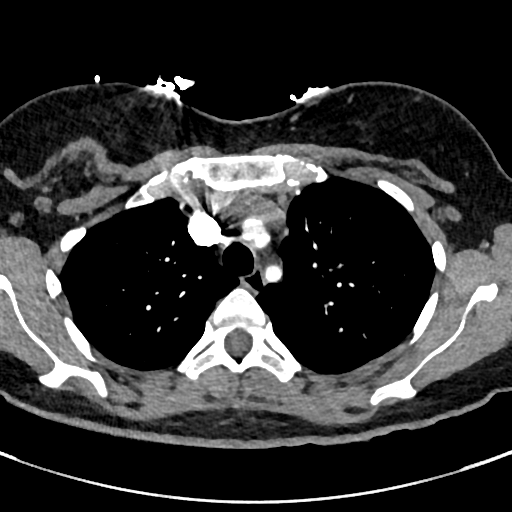
[im 246/296  lung]
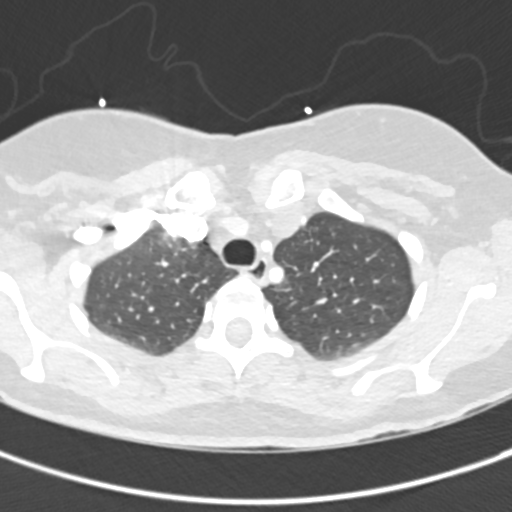
[im 263/296  mediastinal]
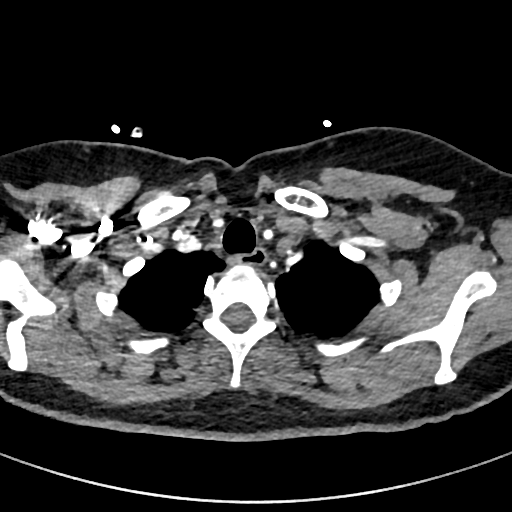
[im 279/296  lung]
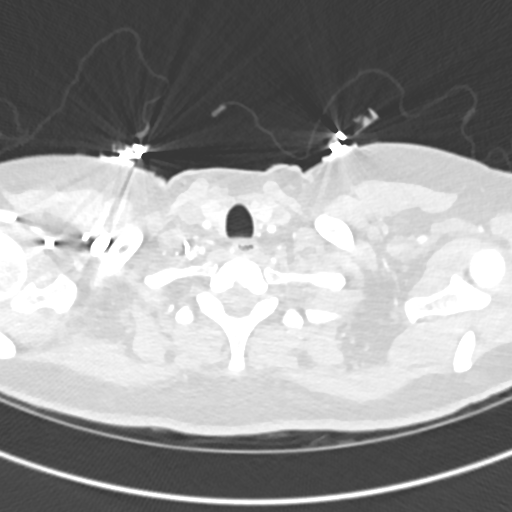

[Series 7: pe 2mm cor · coronal · 0.55mm/px · 1 of 90 slices shown]
[im 45/90  mediastinal]
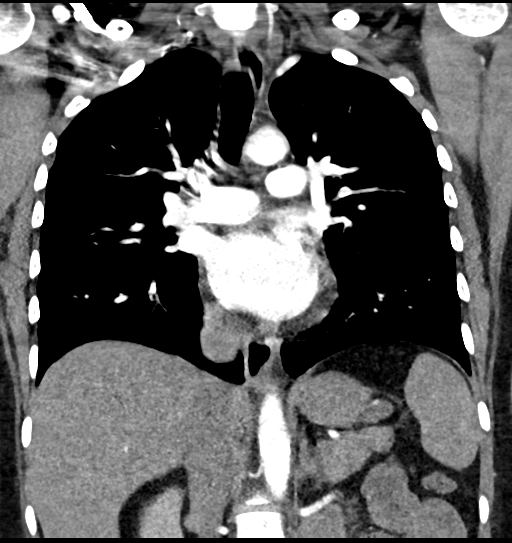

[18 of 36 positions shown; findings below may reference images not displayed]

FINDINGS: Cardiovascular: There is no demonstrable pulmonary embolus. There is
no thoracic aortic aneurysm or dissection. The visualize great
vessels appear unremarkable. The right and left common carotid
arteries arise as a common trunk, an anatomic variant. There is
calcification in the proximal left anterior descending coronary
artery. There is mild left ventricular hypertrophy. Pericardium is
not appreciably thickened.

Mediastinum/Nodes: The thyroid is lobulated without focal dominant
mass. There is no appreciable thoracic adenopathy.

Lungs/Pleura: There is no parenchymal lung edema or consolidation.
There is slight bibasilar atelectasis.

Upper Abdomen: Visualized upper abdominal structures appear
unremarkable.

Musculoskeletal: There are breast implants bilaterally. There are no
blastic or lytic bone lesions.

Review of the MIP images confirms the above findings.
IMPRESSION: No demonstrable pulmonary embolus. No edema or consolidation. No
apparent adenopathy. Bilateral breast implants. Calcification is
noted in the proximal left anterior descending coronary artery.
There is mild left ventricular hypertrophy.

## 2017-11-17 DIAGNOSIS — N939 Abnormal uterine and vaginal bleeding, unspecified: Secondary | ICD-10-CM | POA: Diagnosis not present

## 2017-11-17 DIAGNOSIS — N83291 Other ovarian cyst, right side: Secondary | ICD-10-CM | POA: Diagnosis not present

## 2017-11-17 DIAGNOSIS — N926 Irregular menstruation, unspecified: Secondary | ICD-10-CM | POA: Diagnosis not present

## 2017-11-19 IMAGING — DX DG CHEST 2V
2 series · 2 of 2 positions shown · non-contrast
Comparison: CT 12/11/2015.

CLINICAL DATA: Thyroidectomy.

EXAM:
CHEST  2 VIEW

[chest pa]
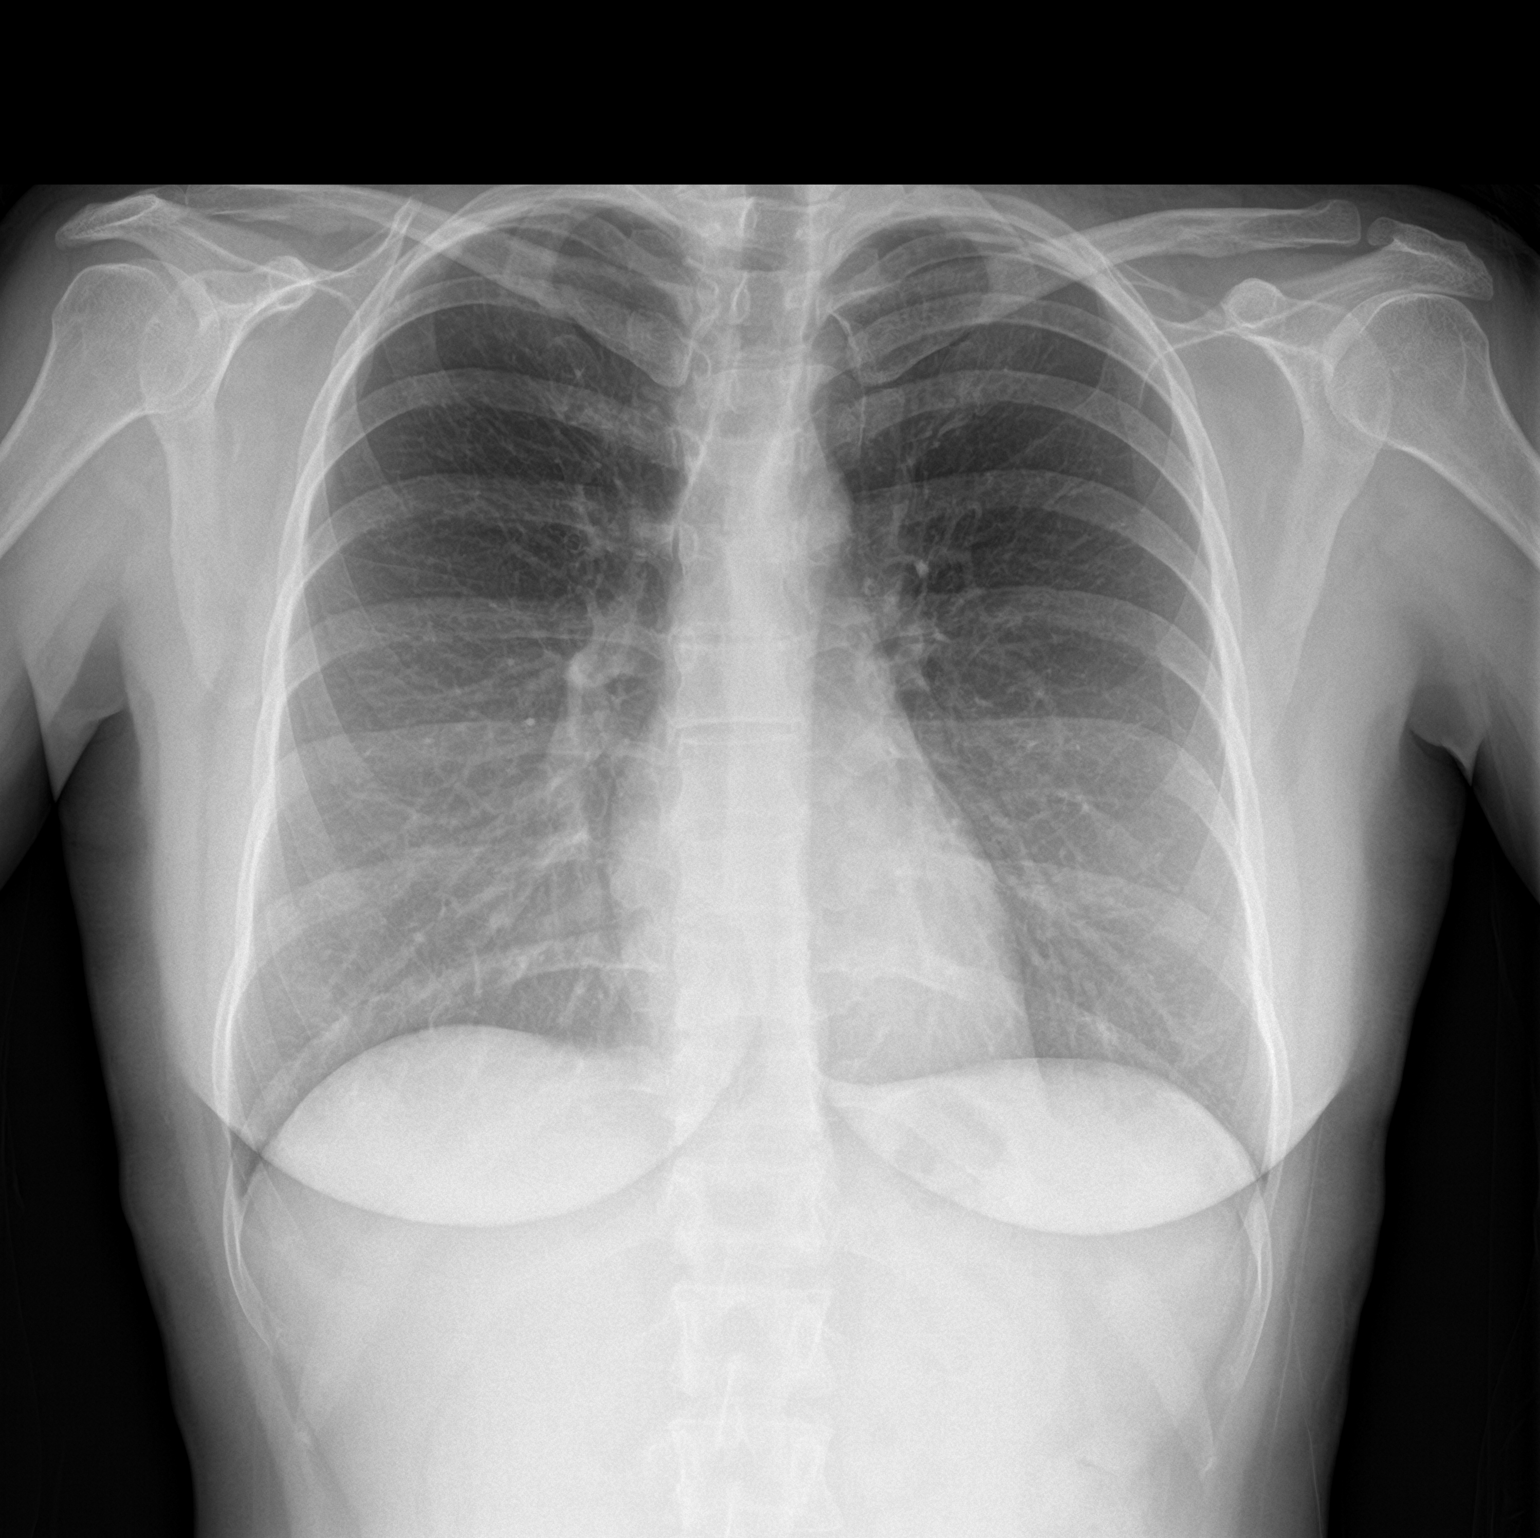

[chest lat]
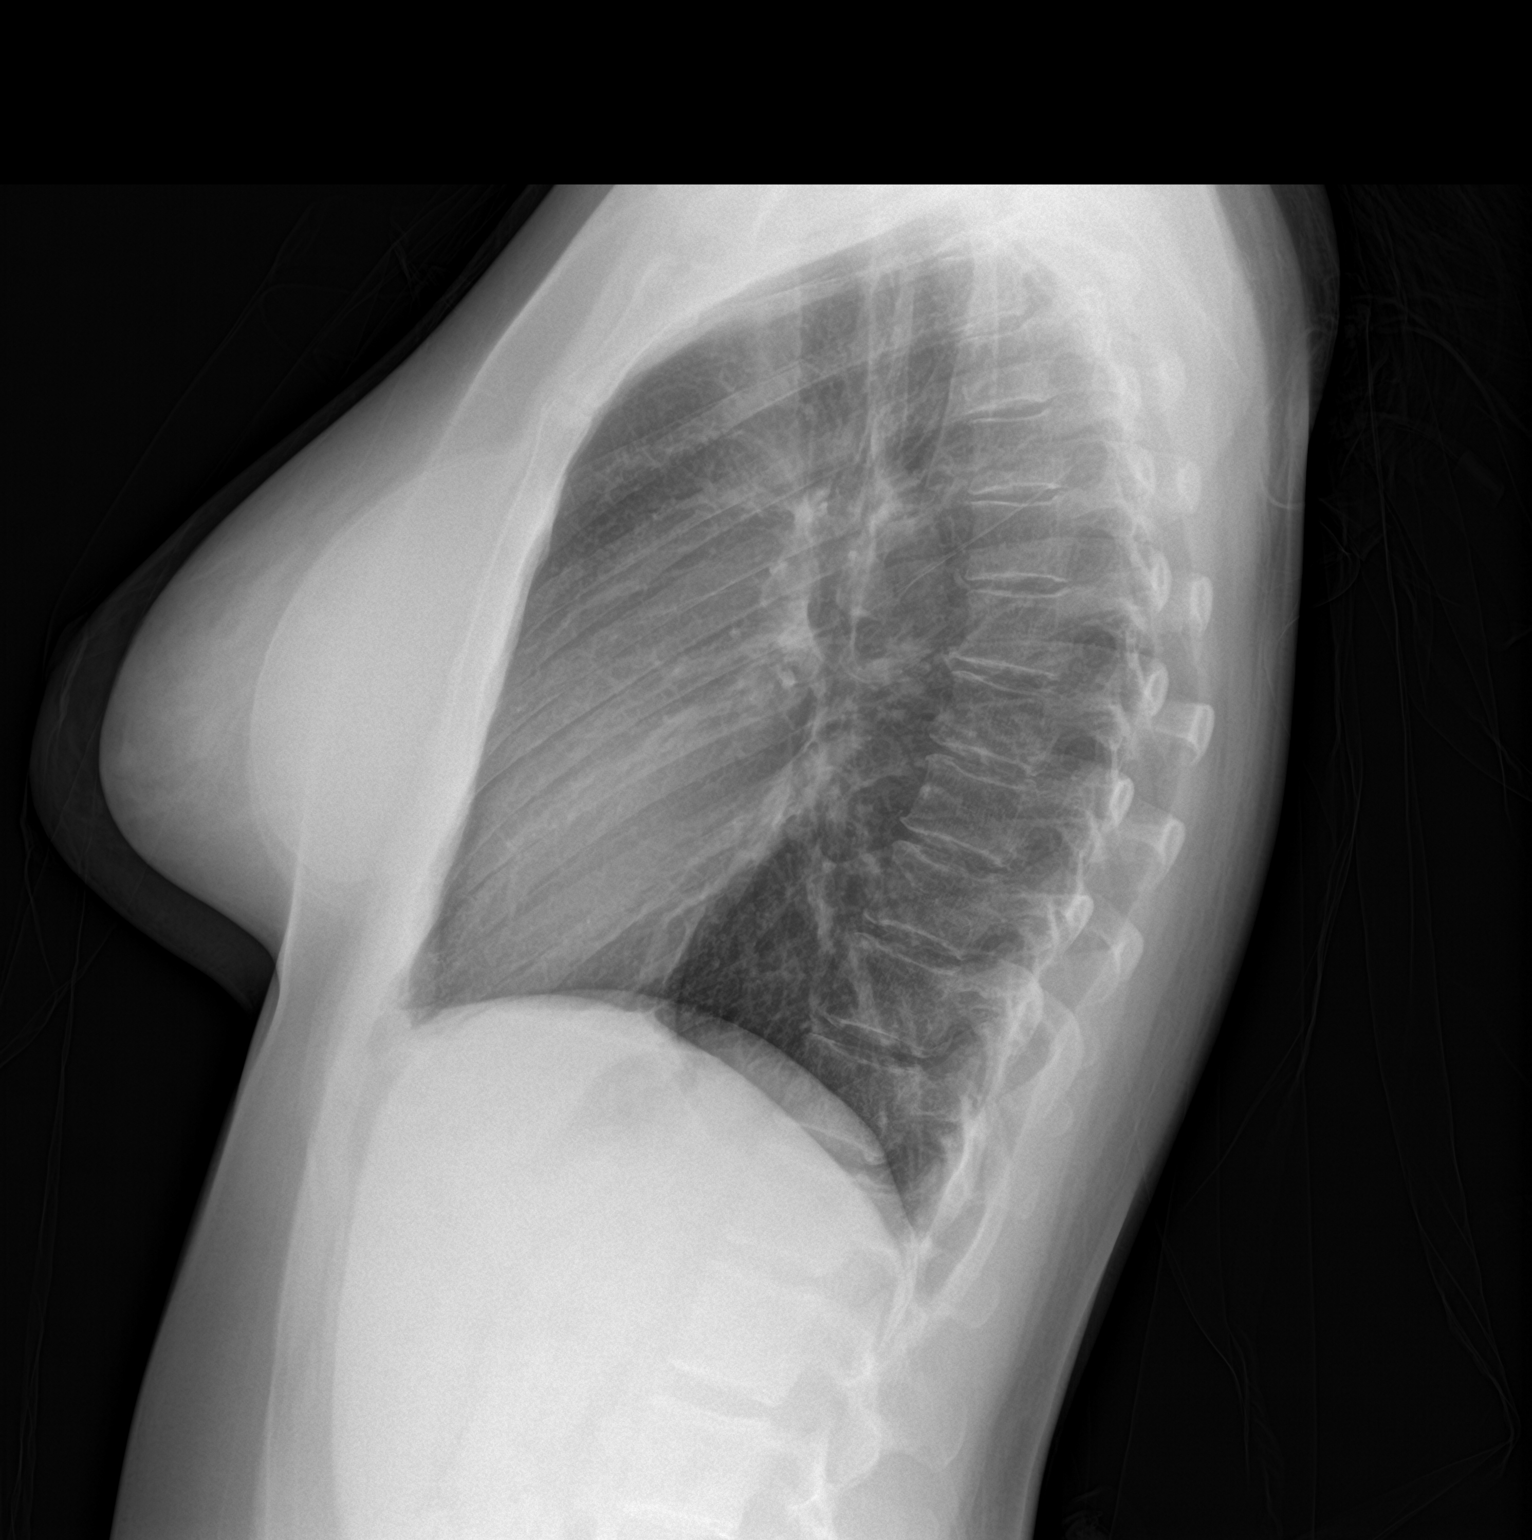

[2 of 2 positions shown; findings below may reference images not displayed]

FINDINGS: The heart size and mediastinal contours are within normal limits.
Both lungs are clear. The visualized skeletal structures are
unremarkable. Bilateral breast implants.
IMPRESSION: No active cardiopulmonary disease.

## 2018-02-15 DIAGNOSIS — N83201 Unspecified ovarian cyst, right side: Secondary | ICD-10-CM | POA: Diagnosis not present

## 2018-02-15 DIAGNOSIS — Z01419 Encounter for gynecological examination (general) (routine) without abnormal findings: Secondary | ICD-10-CM | POA: Diagnosis not present

## 2018-02-15 DIAGNOSIS — R9389 Abnormal findings on diagnostic imaging of other specified body structures: Secondary | ICD-10-CM | POA: Diagnosis not present

## 2018-02-24 DIAGNOSIS — R9389 Abnormal findings on diagnostic imaging of other specified body structures: Secondary | ICD-10-CM | POA: Diagnosis not present

## 2018-03-10 DIAGNOSIS — L658 Other specified nonscarring hair loss: Secondary | ICD-10-CM | POA: Diagnosis not present

## 2018-03-10 DIAGNOSIS — E890 Postprocedural hypothyroidism: Secondary | ICD-10-CM | POA: Diagnosis not present

## 2018-06-15 DIAGNOSIS — E890 Postprocedural hypothyroidism: Secondary | ICD-10-CM | POA: Diagnosis not present

## 2018-06-15 DIAGNOSIS — D509 Iron deficiency anemia, unspecified: Secondary | ICD-10-CM | POA: Diagnosis not present

## 2018-06-15 DIAGNOSIS — Z1211 Encounter for screening for malignant neoplasm of colon: Secondary | ICD-10-CM | POA: Diagnosis not present

## 2018-07-07 DIAGNOSIS — R102 Pelvic and perineal pain: Secondary | ICD-10-CM | POA: Diagnosis not present

## 2018-07-07 DIAGNOSIS — N926 Irregular menstruation, unspecified: Secondary | ICD-10-CM | POA: Diagnosis not present

## 2018-07-07 DIAGNOSIS — Z1231 Encounter for screening mammogram for malignant neoplasm of breast: Secondary | ICD-10-CM | POA: Diagnosis not present

## 2018-07-07 DIAGNOSIS — N912 Amenorrhea, unspecified: Secondary | ICD-10-CM | POA: Diagnosis not present

## 2018-07-07 DIAGNOSIS — Z1239 Encounter for other screening for malignant neoplasm of breast: Secondary | ICD-10-CM | POA: Diagnosis not present

## 2018-07-14 ENCOUNTER — Other Ambulatory Visit: Payer: Self-pay | Admitting: Obstetrics and Gynecology

## 2018-07-14 DIAGNOSIS — N6310 Unspecified lump in the right breast, unspecified quadrant: Secondary | ICD-10-CM

## 2018-07-14 DIAGNOSIS — R5381 Other malaise: Secondary | ICD-10-CM

## 2018-07-16 ENCOUNTER — Other Ambulatory Visit: Payer: Self-pay | Admitting: Obstetrics and Gynecology

## 2018-07-16 ENCOUNTER — Ambulatory Visit
Admission: RE | Admit: 2018-07-16 | Discharge: 2018-07-16 | Disposition: A | Discharge status: Home / Self Care | Payer: 59 | Source: Ambulatory Visit | Attending: Obstetrics and Gynecology | Admitting: Obstetrics and Gynecology

## 2018-07-16 DIAGNOSIS — N6310 Unspecified lump in the right breast, unspecified quadrant: Secondary | ICD-10-CM

## 2018-07-16 DIAGNOSIS — R922 Inconclusive mammogram: Secondary | ICD-10-CM | POA: Diagnosis not present

## 2018-07-16 DIAGNOSIS — N6001 Solitary cyst of right breast: Secondary | ICD-10-CM | POA: Diagnosis not present

## 2018-07-20 DIAGNOSIS — Z719 Counseling, unspecified: Secondary | ICD-10-CM | POA: Diagnosis not present

## 2019-09-01 ENCOUNTER — Telehealth: Payer: Self-pay

## 2019-09-01 ENCOUNTER — Telehealth: Payer: Self-pay | Admitting: Cardiovascular Disease

## 2019-09-01 ENCOUNTER — Other Ambulatory Visit: Payer: Self-pay

## 2019-09-01 DIAGNOSIS — R072 Precordial pain: Secondary | ICD-10-CM

## 2019-09-01 NOTE — Telephone Encounter (Signed)
Received a message from Dr.Berry to order coronary calcium score.Order placed.Message sent to scheduling pool to schedule.

## 2019-09-01 NOTE — Telephone Encounter (Signed)
VM Box is Full- Unable to LM to schedule Calcium Score.Sent patient a my Chart Message

## 2019-09-02 NOTE — Telephone Encounter (Signed)
Spoke with patient regarding appointment for Calcium Scoring scheduled Friday 09/09/19 at 1:30 pm---[address].  Patient voiced her understanding

## 2019-09-09 ENCOUNTER — Ambulatory Visit (INDEPENDENT_AMBULATORY_CARE_PROVIDER_SITE_OTHER)
Admission: RE | Admit: 2019-09-09 | Discharge: 2019-09-09 | Disposition: A | Discharge status: Home / Self Care | Payer: Self-pay | Source: Ambulatory Visit | Attending: Cardiovascular Disease | Admitting: Cardiovascular Disease

## 2019-09-09 ENCOUNTER — Other Ambulatory Visit: Payer: Self-pay

## 2019-09-09 DIAGNOSIS — R072 Precordial pain: Secondary | ICD-10-CM

## 2019-09-20 ENCOUNTER — Other Ambulatory Visit: Payer: Self-pay | Admitting: Obstetrics and Gynecology

## 2019-09-20 DIAGNOSIS — Z1231 Encounter for screening mammogram for malignant neoplasm of breast: Secondary | ICD-10-CM

## 2019-09-21 ENCOUNTER — Ambulatory Visit
Admission: RE | Admit: 2019-09-21 | Discharge: 2019-09-21 | Disposition: A | Discharge status: Home / Self Care | Payer: 59 | Source: Ambulatory Visit

## 2019-09-21 ENCOUNTER — Other Ambulatory Visit: Payer: Self-pay

## 2019-09-21 ENCOUNTER — Other Ambulatory Visit: Payer: Self-pay | Admitting: Obstetrics and Gynecology

## 2019-09-21 DIAGNOSIS — Z1231 Encounter for screening mammogram for malignant neoplasm of breast: Secondary | ICD-10-CM

## 2019-10-07 ENCOUNTER — Other Ambulatory Visit: Payer: Self-pay

## 2019-10-07 ENCOUNTER — Encounter: Payer: Self-pay | Admitting: Sports Medicine

## 2019-10-07 ENCOUNTER — Ambulatory Visit: Payer: 59 | Admitting: Sports Medicine

## 2019-10-07 ENCOUNTER — Ambulatory Visit (INDEPENDENT_AMBULATORY_CARE_PROVIDER_SITE_OTHER): Payer: 59

## 2019-10-07 DIAGNOSIS — M216X2 Other acquired deformities of left foot: Secondary | ICD-10-CM

## 2019-10-07 DIAGNOSIS — M216X1 Other acquired deformities of right foot: Secondary | ICD-10-CM

## 2019-10-07 DIAGNOSIS — M79672 Pain in left foot: Secondary | ICD-10-CM | POA: Diagnosis not present

## 2019-10-07 DIAGNOSIS — M779 Enthesopathy, unspecified: Secondary | ICD-10-CM

## 2019-10-07 DIAGNOSIS — M79671 Pain in right foot: Secondary | ICD-10-CM

## 2019-10-07 MED ORDER — DICLOFENAC SODIUM 75 MG PO TBEC: 75.0000 mg | DELAYED_RELEASE_TABLET | Freq: Two times a day (BID) | ORAL | 0 refills | Status: DC

## 2019-10-07 NOTE — Progress Notes (Signed)
Subjective: Joanne Hunt is a 49 y.o. female patient who presents to office for evaluation of Left>right foot pain. Patient complains of progressive pain especially over the last 4-5 months in the left and right foot at the lateral sides, 7-8/10 with sharp pains worse with walking/standing/major activities. Admits that she fell twice and twisted ankle going up steps. Patient has tried inserts and change in shoes with no relief in symptoms. Patient denies any other pedal complaints.  Review of Systems  All other systems reviewed and are negative.    Patient Active Problem List   Diagnosis Date Noted  . Graves' disease 02/12/2016    No current outpatient medications on file prior to visit.   No current facility-administered medications on file prior to visit.    Allergies  Allergen Reactions  . Ptu [Propylthiouracil] Other (See Comments)    Heart attack, fever  . Codeine Nausea And Vomiting    Objective:  General: Alert and oriented x3 in no acute distress  Dermatology: No open lesions bilateral lower extremities, no webspace macerations, no ecchymosis bilateral, all nails x 10 are well manicured.  Vascular: Dorsalis Pedis and Posterior Tibial pedal pulses palpable, Capillary Fill Time 3 seconds,(+) pedal hair growth bilateral, no edema bilateral lower extremities, Temperature gradient within normal limits.  Neurology: Johney Maine sensation intact via light touch bilateral.    Musculoskeletal: Mild tenderness with palpation at dorsal lateral foot along the 5th ray and peroneal tendon course L>R. Limited ankle joint range of motion bilateral. Strength within normal limits in all groups bilateral.   Gait: Antalgic gait  Xrays  Left and right Foot   Impression:No acute osseous findings  Assessment and Plan: Problem List Items Addressed This Visit    None    Visit Diagnoses    Bilateral foot pain    -  Primary   Relevant Orders   DG Foot Complete Right   DG Foot Complete  Left   Tendonitis       Relevant Medications   diclofenac (VOLTAREN) 75 MG EC tablet   Acquired equinus deformity of both feet           -Complete examination performed -Xrays reviewed -Discussed treatment options -Rx Diclofenac to take as instructed -Dispensed plantar fascial braces for patient to use as instructed bilaterally daily -Recommend patient to stretch and ice as instructed -Continue with good supportive shoes daily -Patient to return to office 3 weeks or sooner if condition worsens.  Landis Martins, DPM

## 2019-10-14 ENCOUNTER — Other Ambulatory Visit: Payer: Self-pay | Admitting: Sports Medicine

## 2019-10-14 ENCOUNTER — Ambulatory Visit: Payer: 59 | Admitting: Nurse Practitioner

## 2019-10-14 DIAGNOSIS — M779 Enthesopathy, unspecified: Secondary | ICD-10-CM

## 2019-10-28 ENCOUNTER — Ambulatory Visit: Payer: 59 | Admitting: Sports Medicine

## 2020-01-13 ENCOUNTER — Encounter: Payer: Self-pay | Admitting: Gastroenterology

## 2020-02-22 ENCOUNTER — Ambulatory Visit (AMBULATORY_SURGERY_CENTER): Payer: Self-pay | Admitting: *Deleted

## 2020-02-22 ENCOUNTER — Encounter: Payer: Self-pay | Admitting: Gastroenterology

## 2020-02-22 ENCOUNTER — Other Ambulatory Visit: Payer: Self-pay

## 2020-02-22 VITALS — Ht 64.0 in | Wt 169.0 lb

## 2020-02-22 DIAGNOSIS — Z01818 Encounter for other preprocedural examination: Secondary | ICD-10-CM

## 2020-02-22 DIAGNOSIS — Z1211 Encounter for screening for malignant neoplasm of colon: Secondary | ICD-10-CM

## 2020-02-22 MED ORDER — PLENVU 140 G PO SOLR: 1.0000 | Freq: Once | ORAL | 0 refills | Status: AC

## 2020-02-22 NOTE — Progress Notes (Signed)
Patient is here in-person for PV. Patient denies any allergies to eggs. Patient is allergic to SOY!!!. Patient denies any problems with anesthesia/sedation. Patient denies any oxygen use at home. Patient denies taking any diet/weight loss medications or blood thinners. Patient is not being treated for MRSA or C-diff. Patient is aware of our care-partner policy and AKLTY-75 safety protocol. EMMI education assisgned to the patient for the procedure, sent to MyChart.   COVID-19 vaccines completed x1 per patient. covid test on 03/07/2020 at 1230pm Prep Prescription coupon given to the patient. Pt did not want miralax prep.

## 2020-03-07 ENCOUNTER — Other Ambulatory Visit: Payer: Self-pay | Admitting: Gastroenterology

## 2020-03-07 LAB — SARS CORONAVIRUS 2 (TAT 6-24 HRS): SARS Coronavirus 2: NEGATIVE

## 2020-03-09 ENCOUNTER — Other Ambulatory Visit: Payer: Self-pay

## 2020-03-09 ENCOUNTER — Other Ambulatory Visit (INDEPENDENT_AMBULATORY_CARE_PROVIDER_SITE_OTHER): Payer: 59

## 2020-03-09 ENCOUNTER — Ambulatory Visit (AMBULATORY_SURGERY_CENTER): Payer: 59 | Admitting: Gastroenterology

## 2020-03-09 ENCOUNTER — Encounter: Payer: Self-pay | Admitting: Gastroenterology

## 2020-03-09 VITALS — BP 128/86 | HR 79 | Temp 97.3°F | Resp 15 | Ht 64.0 in | Wt 169.0 lb

## 2020-03-09 DIAGNOSIS — Z1211 Encounter for screening for malignant neoplasm of colon: Secondary | ICD-10-CM

## 2020-03-09 DIAGNOSIS — D120 Benign neoplasm of cecum: Secondary | ICD-10-CM

## 2020-03-09 DIAGNOSIS — K635 Polyp of colon: Secondary | ICD-10-CM

## 2020-03-09 LAB — BASIC METABOLIC PANEL
BUN: 7 mg/dL (ref 6–23)
CO2: 29 mEq/L (ref 19–32)
Calcium: 8.3 mg/dL — ABNORMAL LOW (ref 8.4–10.5)
Chloride: 108 mEq/L (ref 96–112)
Creatinine, Ser: 0.79 mg/dL (ref 0.40–1.20)
GFR: 88.9 mL/min (ref >60.00)
Glucose, Bld: 83 mg/dL (ref 70–99)
Potassium: 3.8 mEq/L (ref 3.5–5.1)
Sodium: 145 mEq/L (ref 135–145)

## 2020-03-09 MED ORDER — SODIUM CHLORIDE 0.9 % IV SOLN: 500.0000 mL | Freq: Once | INTRAVENOUS | Status: DC

## 2020-03-09 NOTE — Op Note (Signed)
[facility] Patient Name: Joanne Hunt Procedure Date: 03/09/2020 12:30 PM MRN: [id number] Endoscopist: Justice Britain , MD Age: 49 Referring MD:  Date of Birth: Nov 24, 1970 Gender: Female Account #: [id number] Procedure:                Colonoscopy Indications:              Screening for colorectal malignant neoplasm, This                            is the patient's first colonoscopy Medicines:                Monitored Anesthesia Care Procedure:                Pre-Anesthesia Assessment:                           - Prior to the procedure, a History and Physical                            was performed, and patient medications and                            allergies were reviewed. The patient's tolerance of                            previous anesthesia was also reviewed. The risks                            and benefits of the procedure and the sedation                            options and risks were discussed with the patient.                            All questions were answered, and informed consent                            was obtained. Prior Anticoagulants: The patient has                            taken no previous anticoagulant or antiplatelet                            agents. ASA Grade Assessment: II - A patient with                            mild systemic disease. After reviewing the risks                            and benefits, the patient was deemed in                            satisfactory condition to undergo the procedure.  After obtaining informed consent, the colonoscope                            was passed under direct vision. Throughout the                            procedure, the patient's blood pressure, pulse, and                            oxygen saturations were monitored continuously. The                            Colonoscope was introduced through the anus and                            advanced to the 5 cm  into the ileum. The                            colonoscopy was performed without difficulty. The                            patient tolerated the procedure. The quality of the                            bowel preparation was good. The terminal ileum,                            ileocecal valve, appendiceal orifice, and rectum                            were photographed. Scope In: 12:44:13 PM Scope Out: 72:82:06 PM Scope Withdrawal Time: 0 hours 9 minutes 10 seconds  Total Procedure Duration: 0 hours 12 minutes 36 seconds  Findings:                 The digital rectal exam findings include                            hemorrhoids. Pertinent negatives include no                            palpable rectal lesions.                           The terminal ileum and ileocecal valve appeared                            normal.                           One medium submucosal nodule was found at the                            appendiceal orifice - query if this could be a bit  of inverted appendix vs potential developing                            mucocele (no overt mucous noted however). Biopsies                            were taken with a cold forceps for histology.                           A single small-mouthed diverticulum was found in                            the cecum.                           Normal mucosa was found in the entire colon                            otherwise.                           Non-bleeding non-thrombosed external and internal                            hemorrhoids were found during retroflexion, during                            perianal exam and during digital exam. The                            hemorrhoids were Grade II (internal hemorrhoids                            that prolapse but reduce spontaneously). Complications:            No immediate complications. Estimated Blood Loss:     Estimated blood loss was minimal. Impression:                - Hemorrhoids found on digital rectal exam.                           - The examined portion of the ileum was normal.                           - Submucosal nodule vs inverted appendix vs                            developing mucocele (no mucous noted). Biopsied.                           - Diverticula in the cecum.                           - Normal mucosa in the entire examined colon  otherwise.                           - Non-bleeding non-thrombosed external and internal                            hemorrhoids. Recommendation:           - The patient will be observed post-procedure,                            until all discharge criteria are met.                           - Discharge patient to home.                           - Patient has a contact number available for                            emergencies. The signs and symptoms of potential                            delayed complications were discussed with the                            patient. Return to normal activities tomorrow.                            Written discharge instructions were provided to the                            patient.                           - High fiber diet.                           - Use FiberCon 1-2 tablets PO daily.                           - Continue present medications.                           - Await pathology results.                           - Will likely recommend CTAP with IV/PO contrast                            for further delineation based on final pathology                            results.                           - Repeat colonoscopy in 10 years for screening  purposes.                           - The findings and recommendations were discussed                            with the patient. Justice Britain, MD 03/09/2020 1:03:25 PM

## 2020-03-09 NOTE — Progress Notes (Signed)
Pt's states no medical or surgical changes since previsit or office visit. 

## 2020-03-09 NOTE — Patient Instructions (Addendum)
Findings: Hemorrhoids, Submucosal Nodule vs. Inverted appendix, Diverticulosis, High Fiber Diet (Handouts given)  Repeat Colonoscopy in 10 years. Sent to lab prior to discharge for BMP. CT contrast given to patient with instruction sheet.  YOU HAD AN ENDOSCOPIC PROCEDURE TODAY AT [facility]:   Refer to the procedure report that was given to you for any specific questions about what was found during the examination.  If the procedure report does not answer your questions, please call your gastroenterologist to clarify.  If you requested that your care partner not be given the details of your procedure findings, then the procedure report has been included in a sealed envelope for you to review at your convenience later.  YOU SHOULD EXPECT: Some feelings of bloating in the abdomen. Passage of more gas than usual.  Walking can help get rid of the air that was put into your GI tract during the procedure and reduce the bloating. If you had a lower endoscopy (such as a colonoscopy or flexible sigmoidoscopy) you may notice spotting of blood in your stool or on the toilet paper. If you underwent a bowel prep for your procedure, you may not have a normal bowel movement for a few days.  Please Note:  You might notice some irritation and congestion in your nose or some drainage.  This is from the oxygen used during your procedure.  There is no need for concern and it should clear up in a day or so.  SYMPTOMS TO REPORT IMMEDIATELY:   Following lower endoscopy (colonoscopy or flexible sigmoidoscopy):  Excessive amounts of blood in the stool  Significant tenderness or worsening of abdominal pains  Swelling of the abdomen that is new, acute  Fever of 100F or higher   For urgent or emergent issues, a gastroenterologist can be reached at any hour by calling [phone]. Do not use MyChart messaging for urgent concerns.    DIET:  We do recommend a small meal at first, but then you may  proceed to your regular diet.  Drink plenty of fluids but you should avoid alcoholic beverages for 24 hours.  ACTIVITY:  You should plan to take it easy for the rest of today and you should NOT DRIVE or use heavy machinery until tomorrow (because of the sedation medicines used during the test).    FOLLOW UP: Our staff will call the number listed on your records 48-72 hours following your procedure to check on you and address any questions or concerns that you may have regarding the information given to you following your procedure. If we do not reach you, we will leave a message.  We will attempt to reach you two times.  During this call, we will ask if you have developed any symptoms of COVID 19. If you develop any symptoms (ie: fever, flu-like symptoms, shortness of breath, cough etc.) before then, please call [phone].  If you test positive for Covid 19 in the 2 weeks post procedure, please call and report this information to [place].    If any biopsies were taken you will be contacted by phone or by letter within the next 1-3 weeks.  Please call us at [phone] if you have not heard about the biopsies in 3 weeks.    SIGNATURES/CONFIDENTIALITY: You and/or your care partner have signed paperwork which will be entered into your electronic medical record.  These signatures attest to the fact that that the information above on your After Visit Summary has been reviewed and  is understood.  Full responsibility of the confidentiality of this discharge information lies with you and/or your care-partner.

## 2020-03-09 NOTE — Progress Notes (Signed)
Called to room to assist during endoscopic procedure.  Patient ID and intended procedure confirmed with present staff. Received instructions for my participation in the procedure from the performing physician.  

## 2020-03-09 NOTE — Progress Notes (Signed)
Report given to PACU, vss 

## 2020-03-13 ENCOUNTER — Telehealth: Payer: Self-pay

## 2020-03-13 ENCOUNTER — Telehealth: Payer: Self-pay | Admitting: Gastroenterology

## 2020-03-13 NOTE — Telephone Encounter (Signed)
Pt is requesting a call back regarding her lab results and to also notify her when her pathology report comes back.

## 2020-03-13 NOTE — Telephone Encounter (Signed)
Pt states she is doing good

## 2020-03-13 NOTE — Telephone Encounter (Signed)
The pt has been advised of the results of the lab. She was told that as soon as the path has resulted she will be contacted either by phone or letter The pt has been advised of the information and verbalized understanding.

## 2020-03-13 NOTE — Telephone Encounter (Signed)
°  Follow up Call-  Call back number 03/09/2020  Post procedure Call Back phone  # [phone]  Permission to leave phone message Yes  Some recent data might be hidden    1st follow up call made.  Mailbox full.  NAULM

## 2020-03-13 NOTE — Telephone Encounter (Signed)
Overall normal results. Your calcium level was slightly low. I would recommend that you begin an over-the-counter calcium supplementation with vitamin D if you are not already taking that. Certainly consideration of just being on a multivitamin that has calcium and vitamin D can also be considered. You can address this further with your primary care doctor as necessary. Your kidney function and other electrolytes are otherwise normal.

## 2020-03-19 ENCOUNTER — Telehealth: Payer: Self-pay | Admitting: Gastroenterology

## 2020-03-19 DIAGNOSIS — K639 Disease of intestine, unspecified: Secondary | ICD-10-CM

## 2020-03-19 NOTE — Telephone Encounter (Signed)
Left message on machine to call back  

## 2020-03-19 NOTE — Telephone Encounter (Signed)
Joanne Hunt, I am sorry. I did call her. I went over results of pathology showing no evidence of an appendiceal adenoma. However, the area is concerning for potential appendiceal mucocele or dilated appendix vs inverted appendix.   She needs a CTAP with IV/PO contrast for further delineate of this cecal/appendiceal lesion. Thank you. GM

## 2020-03-19 NOTE — Telephone Encounter (Signed)
Dr Rush Landmark the pt states she spoke to you on the phone last week and someone is suppose to contacting her about a CT scan. I do not have a message.  Did you meant to send me a message about setting her up for a CT scan?

## 2020-03-19 NOTE — Telephone Encounter (Signed)
CT scan scheduled for 03/26/20 at [facility] to arrive at 115 pm.  Pt to pick up contrast atleast 2 days prior

## 2020-03-19 NOTE — Telephone Encounter (Signed)
Pt is requesting a call back from a nurse to discuss a CT scan she needs scheduled

## 2020-03-20 NOTE — Telephone Encounter (Signed)
The pt has viewed the appt information via My Chart.

## 2020-03-21 ENCOUNTER — Encounter: Payer: Self-pay | Admitting: Gastroenterology

## 2020-03-26 ENCOUNTER — Encounter (HOSPITAL_COMMUNITY): Payer: Self-pay

## 2020-03-26 ENCOUNTER — Ambulatory Visit (HOSPITAL_COMMUNITY)
Admission: RE | Admit: 2020-03-26 | Discharge: 2020-03-26 | Disposition: A | Discharge status: Home / Self Care | Payer: 59 | Source: Ambulatory Visit | Attending: Gastroenterology | Admitting: Gastroenterology

## 2020-03-26 ENCOUNTER — Other Ambulatory Visit: Payer: Self-pay

## 2020-03-26 DIAGNOSIS — K639 Disease of intestine, unspecified: Secondary | ICD-10-CM | POA: Diagnosis not present

## 2020-03-26 MED ORDER — IOHEXOL 300 MG/ML  SOLN
100.0000 mL | Freq: Once | INTRAMUSCULAR | Status: AC | PRN
  Administered 2020-03-26: 100 mL via INTRAVENOUS

## 2020-03-28 ENCOUNTER — Telehealth: Payer: Self-pay | Admitting: Gastroenterology

## 2020-03-28 NOTE — Telephone Encounter (Signed)
Pt called inquiring about imaging results.  

## 2020-03-28 NOTE — Telephone Encounter (Signed)
The pt has been advised that the results have not been reviewed and we will contact her as soon as reviewed.

## 2020-03-30 NOTE — Telephone Encounter (Signed)
Dr Rush Landmark have you reviewed the CT results for this pt?  She is anxious about the results.

## 2020-03-30 NOTE — Telephone Encounter (Signed)
Pt is checking on the status of her CT scan results. Pt would like the results before the weekend so she doesn't have to worry about them.

## 2020-04-01 NOTE — Telephone Encounter (Signed)
I called and spoke with patient on 11/12 PM. We discussed the results of the CT scan. There is a lesion that is in close contact with the appendix and the right ovary. It is an abnormality. She is asymptomatic. She does need further workup as well as potentially surgical management and it is not clear that this would not require combined assistance with OB-Gyn.  Here is the plan: 1) Surgical referral to Colorectal surgery ([facility]) 2) [facility] Discussion in next few weeks to review case Malachy Mood, could you help with this in the next few weeks?) 3) Forward the results of the scan to the patient's PCP and to the patient OB-Gyn (Dr. Durene Fruits in [facility]) 4) Please print out a copy for the patient or mail her the results if not already on MyChart.  Thank you all.  GM

## 2020-04-02 ENCOUNTER — Other Ambulatory Visit: Payer: Self-pay

## 2020-04-02 NOTE — Telephone Encounter (Signed)
I will add to tumor board on 11/22.

## 2020-04-02 NOTE — Telephone Encounter (Signed)
Referral made to [facility] can copy sent to Dr Shelly Flatten.  The pt has a copy of the report in My Chart.

## 2020-04-02 NOTE — Telephone Encounter (Signed)
Thanks to you both. GM

## 2020-04-05 ENCOUNTER — Telehealth: Payer: Self-pay | Admitting: Gastroenterology

## 2020-04-05 NOTE — Telephone Encounter (Signed)
The pt has been advised the referral was faxed on 11/15 and that [facility] will make the appt with the doc that has an opening and will call her directly.  She will call if she has not heard from them by next week.

## 2020-04-11 ENCOUNTER — Ambulatory Visit (HOSPITAL_COMMUNITY)
Admission: RE | Admit: 2020-04-11 | Discharge: 2020-04-11 | Disposition: A | Discharge status: Home / Self Care | Payer: 59 | Source: Ambulatory Visit | Attending: Surgery | Admitting: Surgery

## 2020-04-11 ENCOUNTER — Other Ambulatory Visit: Payer: Self-pay

## 2020-04-11 ENCOUNTER — Other Ambulatory Visit (HOSPITAL_COMMUNITY): Payer: Self-pay | Admitting: Surgery

## 2020-04-11 ENCOUNTER — Other Ambulatory Visit: Payer: Self-pay | Admitting: Surgery

## 2020-04-11 DIAGNOSIS — K388 Other specified diseases of appendix: Secondary | ICD-10-CM | POA: Diagnosis present

## 2020-04-13 ENCOUNTER — Encounter: Payer: Self-pay | Admitting: Gastroenterology

## 2020-04-13 NOTE — Progress Notes (Signed)
Case discussed at [facility] on 11/24. Imaging was reviewed. Colorectal surgeons who were present did discuss potential resection and felt that they could try to remove this area without disturbing the ovary or potentially having an en bloc complete resection. Referral to [facility] surgery has already been placed. Follow-up to be dictated after the patient has been evaluated by surgery. Patty, when you return on Monday, please let the patient know that the surgeons who are present do believe that surgical resection is possible and can be performed. They will make final determinations after she is seen in clinic. Thanks. GM

## 2020-04-16 NOTE — Progress Notes (Signed)
The pt has been advised and has an appt today with the surgeon to discuss.  Will also forward to the PCP.

## 2020-04-18 ENCOUNTER — Telehealth: Payer: Self-pay | Admitting: *Deleted

## 2020-04-18 NOTE — Telephone Encounter (Signed)
Patient called and scheduled a new patient appt with Dr Denman George on 12/6 at 11:15 am. Patient given the address and phone number for the clinic. Patient also given the policy for mask and visitors

## 2020-04-23 ENCOUNTER — Other Ambulatory Visit: Payer: Self-pay

## 2020-04-23 ENCOUNTER — Inpatient Hospital Stay: Payer: 59 | Attending: Gynecologic Oncology | Admitting: Gynecologic Oncology

## 2020-04-23 ENCOUNTER — Encounter: Payer: Self-pay | Admitting: Gynecologic Oncology

## 2020-04-23 VITALS — BP 133/84 | HR 74 | Temp 96.8°F | Resp 20 | Ht 64.0 in | Wt 157.0 lb

## 2020-04-23 DIAGNOSIS — N920 Excessive and frequent menstruation with regular cycle: Secondary | ICD-10-CM | POA: Insufficient documentation

## 2020-04-23 DIAGNOSIS — E785 Hyperlipidemia, unspecified: Secondary | ICD-10-CM | POA: Insufficient documentation

## 2020-04-23 DIAGNOSIS — R1900 Intra-abdominal and pelvic swelling, mass and lump, unspecified site: Secondary | ICD-10-CM

## 2020-04-23 DIAGNOSIS — N946 Dysmenorrhea, unspecified: Secondary | ICD-10-CM | POA: Insufficient documentation

## 2020-04-23 DIAGNOSIS — E0500 Thyrotoxicosis with diffuse goiter without thyrotoxic crisis or storm: Secondary | ICD-10-CM | POA: Diagnosis not present

## 2020-04-23 DIAGNOSIS — Z79899 Other long term (current) drug therapy: Secondary | ICD-10-CM | POA: Insufficient documentation

## 2020-04-23 DIAGNOSIS — F419 Anxiety disorder, unspecified: Secondary | ICD-10-CM | POA: Diagnosis not present

## 2020-04-23 DIAGNOSIS — E7800 Pure hypercholesterolemia, unspecified: Secondary | ICD-10-CM | POA: Diagnosis not present

## 2020-04-23 NOTE — H&P (View-Only) (Signed)
Consult Note: Gyn-Onc  Consult was requested by Dr. Annye English for the evaluation of Joanne Hunt 49 y.o. female  CC:  Chief Complaint  Patient presents with  . Pelvic mass    Assessment/Plan:  Ms. Sarrah Fiorenza  is a 49 y.o.  year old with abnormal uterine bleeding and dysmenorrhea with likely adenomyosis and/or endometriosis and an appendiceal mass.  She is scheduled for an appendectomy with Dr Dema Severin on 05/09/20. I recommend a hysterectomy with RSO at the same time. I believe that this can be accomplished robotically. I recommended preserving the left ovary at the time of surgery given that she appears to be at average risk for ovarian cancer. I counseled her that BSO in the absence of ovarian cancer is associated with increased all-cause mortality (earlier life-expectancy) and that screening for ovarian cancer is not recommended for average risk women.  She is interested in pursuing genetic testing to determine her absolute risk for ovarian cancer given her adopted status.  I counseled her about surgical risk including  bleeding, infection, damage to internal organs (such as bladder,ureters, bowels), blood clot, reoperation and rehospitalization. I counseled her about anticipated surgical recovery.  I believe she will do well with a same-day surgical discharge provided length of surgery is reasonable (<4 hours) and she is meeting goals in PACU.    HPI: Ms Joanne Hunt is a 49 year old P4 who was seen in consultation at the request of Dr Dema Severin for evaluation of abnormal uterine bleeding and dysmenorrhea in association with an appendiceal (or para-appendiceal mass).  The patient underwent a routine screening colonoscopy in October 2021.  At that time they noted a normal colonic mucosa however the appendiceal orifice appeared inverted.  This led to her being referred to Dr. Dema Severin and a CT scan of the abdomen and pelvis being performed.  This imaging was performed on 03/26/2020  and revealed a focal area of enhancing soft tissue prominence measuring approximate 1.9 x 1.4 x 2 cm in close proximity to the appendix and cecum.  It came in direct contact with the superior aspect of the right ovary.  There was no dilated fluid or mucin filled tubular structure in this region to clearly indicate the presence of a mucocele.  No surrounding periappendiceal inflammatory changes.  Dr. Dema Severin had some concern for the possibility of appendiceal endometriosis.  The patient reported increasing dysmenorrhea in the preceding year.  She also noted amenorrhea for 2 to 3 months and irregular menstrual cycles.  She had new onset menorrhagia within the preceding 6 months.  She was noting menopausal symptoms of hot flashes.  A transvaginal ultrasound scan was ordered by Dr. Dema Severin and performed on 04/11/2020.  This showed a uterus measuring 8.8 x 3.1 x 4.3 cm.  There is slight heterogeneous appearance of the uterus which may be related to adenomyosis.  The endometrium was 4 mm in thickness.  The right ovary measured 3.4 x 1.7 x 1.1 cm.  It was normal in appearance with no masses and no soft tissue lesions was seen on the ultrasound scan.  The left ovary was grossly normal.  The patient otherwise is treated for hypercholesterolemia and takes Ozempic for weight gain.  She has a history of Graves' disease and is status post thyroidectomy.  Her surgical history is most significant for a diagnostic exploratory laparotomy approximately 20 years prior in [place] for severe right lower quadrant pain that was self-limiting.  The laparotomy was negative for pathologic findings.  Her  gynecologic history is remarkable for 4 prior vaginal deliveries.  She denied history of infertility.  She is never taken oral contraceptive pills.  Her family cancer history is difficult to elicit due to her history of being adopted.  However she does note that her maternal grandmother had leukemia in her 34s.  She is a  stay-at-home mom. She lives with her husband and youngest child who is 49.   Current Meds:  Outpatient Encounter Medications as of 04/23/2020  Medication Sig  . ALPRAZolam (XANAX) 0.5 MG tablet Take 0.5 mg by mouth 3 (three) times daily as needed.   Marland Kitchen escitalopram (LEXAPRO) 5 MG tablet Take 5 mg by mouth daily.  Marland Kitchen ezetimibe (ZETIA) 10 MG tablet Take 10 mg by mouth daily.  . FEROSUL 325 (65 Fe) MG tablet Take 325 mg by mouth daily.  Marland Kitchen levothyroxine (SYNTHROID) 125 MCG tablet Take 125 mcg by mouth daily.  Marland Kitchen OZEMPIC, 1 MG/DOSE, 4 MG/3ML SOPN INJECT 1 PEN-INJECTOR UNDER THE SKIN WEEKLY  . REPATHA SURECLICK 497 MG/ML SOAJ SMARTSIG:1 Milliliter(s) Topical Every 2 Weeks   No facility-administered encounter medications on file as of 04/23/2020.    Allergy:  Allergies  Allergen Reactions  . Ptu [Propylthiouracil] Other (See Comments)    Heart attack, fever  . Codeine Nausea And Vomiting  . Soy Allergy Other (See Comments)    "puffy eyes and I don't feel well"    Social Hx:   Social History   Socioeconomic History  . Marital status: Married    Spouse name: Not on file  . Number of children: Not on file  . Years of education: Not on file  . Highest education level: Not on file  Occupational History  . Not on file  Tobacco Use  . Smoking status: Never Smoker  . Smokeless tobacco: Never Used  Vaping Use  . Vaping Use: Never used  Substance and Sexual Activity  . Alcohol use: No  . Drug use: No  . Sexual activity: Yes    Birth control/protection: None  Other Topics Concern  . Not on file  Social History Narrative  . Not on file   Social Determinants of Health   Financial Resource Strain:   . Difficulty of Paying Living Expenses: Not on file  Food Insecurity:   . Worried About Charity fundraiser in the Last Year: Not on file  . Ran Out of Food in the Last Year: Not on file  Transportation Needs:   . Lack of Transportation (Medical): Not on file  . Lack of Transportation  (Non-Medical): Not on file  Physical Activity:   . Days of Exercise per Week: Not on file  . Minutes of Exercise per Session: Not on file  Stress:   . Feeling of Stress : Not on file  Social Connections:   . Frequency of Communication with Friends and Family: Not on file  . Frequency of Social Gatherings with Friends and Family: Not on file  . Attends Religious Services: Not on file  . Active Member of Clubs or Organizations: Not on file  . Attends Archivist Meetings: Not on file  . Marital Status: Not on file  Intimate Partner Violence:   . Fear of Current or Ex-Partner: Not on file  . Emotionally Abused: Not on file  . Physically Abused: Not on file  . Sexually Abused: Not on file    Past Surgical Hx:  Past Surgical History:  Procedure Laterality Date  . BREAST ENHANCEMENT SURGERY    .  HERNIA REPAIR  08/1972   2 months old  . THYROIDECTOMY N/A 03/03/2016   Procedure: TOTAL THYROIDECTOMY;  Surgeon: Armandina Gemma, MD;  Location: [facility];  Service: General;  Laterality: N/A;  . WISDOM TOOTH EXTRACTION      Past Medical Hx:  Past Medical History:  Diagnosis Date  . Adopted    "patient is adopted"  . Anxiety   . Dysrhythmia    tachycardia  . Hyperlipidemia   . Thyroid disease    Graves disease    Past Gynecological History:  See HPI No LMP recorded. Patient is perimenopausal.  Family Hx:  Family History  Adopted: Yes  Problem Relation Age of Onset  . Leukemia Maternal Grandmother   . Breast cancer Neg Hx   . Colon cancer Neg Hx   . Prostate cancer Neg Hx   . Pancreatic cancer Neg Hx   . Ovarian cancer Neg Hx     Review of Systems:  Constitutional  Feels well,    ENT Normal appearing ears and nares bilaterally Skin/Breast  No rash, sores, jaundice, itching, dryness Cardiovascular  No chest pain, shortness of breath, or edema  Pulmonary  No cough or wheeze.  Gastro Intestinal  No nausea, vomitting, or diarrhoea. No bright red blood per rectum,  no abdominal pain, change in bowel movement, or constipation.  Genito Urinary  No frequency, urgency, dysuria,  Musculo Skeletal  No myalgia, arthralgia, joint swelling or pain  Neurologic  No weakness, numbness, change in gait,  Psychology  No depression, anxiety, insomnia.   Vitals:  Blood pressure 133/84, pulse 74, temperature (!) 96.8 F (36 C), resp. rate 20, height 5\' 4"  (1.626 m), weight 157 lb (71.2 kg), SpO2 100 %.  Physical Exam: WD in NAD Neck  Supple NROM, without any enlargements.  Lymph Node Survey No cervical supraclavicular or inguinal adenopathy Cardiovascular  Well perfused peripheries Lungs  No increased WOB Skin  No rash/lesions/breakdown  Psychiatry  Alert and oriented to person, place, and time  Abdomen  Normoactive bowel sounds, abdomen soft, non-tender and nonobese without evidence of hernia.  Back No CVA tenderness Genito Urinary  Vulva/vagina: Normal external female genitalia.  No lesions. No discharge or bleeding.  Bladder/urethra:  No lesions or masses, well supported bladder  Vagina: normal  Cervix: Normal appearing, no lesions.  Uterus:  Small, mobile, no parametrial involvement or nodularity.  Adnexa: no palpable masses. Rectal  deferred Extremities  No bilateral cyanosis, clubbing or edema.  60 minutes of total time was spent for this patient encounter, including preparation, face-to-face counseling with the patient and coordination of care, review of imaging (results and images), communication with the referring provider and documentation of the encounter.   Thereasa Solo, MD  04/23/2020, 1:25 PM

## 2020-04-23 NOTE — Progress Notes (Signed)
Consult Note: Gyn-Onc  Consult was requested by Dr. Annye English for the evaluation of Joanne Hunt 49 y.o. female  CC:  Chief Complaint  Patient presents with  . Pelvic mass    Assessment/Plan:  [name]  is a 49 y.o.  year old with abnormal uterine bleeding and dysmenorrhea with likely adenomyosis and/or endometriosis and an appendiceal mass.  She is scheduled for an appendectomy with Dr Dema Severin on 05/09/20. I recommend a hysterectomy with RSO at the same time. I believe that this can be accomplished robotically. I recommended preserving the left ovary at the time of surgery given that she appears to be at average risk for ovarian cancer. I counseled her that BSO in the absence of ovarian cancer is associated with increased all-cause mortality (earlier life-expectancy) and that screening for ovarian cancer is not recommended for average risk women.  She is interested in pursuing genetic testing to determine her absolute risk for ovarian cancer given her adopted status.  I counseled her about surgical risk including  bleeding, infection, damage to internal organs (such as bladder,ureters, bowels), blood clot, reoperation and rehospitalization. I counseled her about anticipated surgical recovery.  I believe she will do well with a same-day surgical discharge provided length of surgery is reasonable (<4 hours) and she is meeting goals in PACU.    HPI: Ms Joanne Hunt is a 49 year old P4 who was seen in consultation at the request of Dr Dema Severin for evaluation of abnormal uterine bleeding and dysmenorrhea in association with an appendiceal (or para-appendiceal mass).  The patient underwent a routine screening colonoscopy in October 2021.  At that time they noted a normal colonic mucosa however the appendiceal orifice appeared inverted.  This led to her being referred to Dr. Dema Severin and a CT scan of the abdomen and pelvis being performed.  This imaging was performed on 03/26/2020  and revealed a focal area of enhancing soft tissue prominence measuring approximate 1.9 x 1.4 x 2 cm in close proximity to the appendix and cecum.  It came in direct contact with the superior aspect of the right ovary.  There was no dilated fluid or mucin filled tubular structure in this region to clearly indicate the presence of a mucocele.  No surrounding periappendiceal inflammatory changes.  Dr. Dema Severin had some concern for the possibility of appendiceal endometriosis.  The patient reported increasing dysmenorrhea in the preceding year.  She also noted amenorrhea for 2 to 3 months and irregular menstrual cycles.  She had new onset menorrhagia within the preceding 6 months.  She was noting menopausal symptoms of hot flashes.  A transvaginal ultrasound scan was ordered by Dr. Dema Severin and performed on 04/11/2020.  This showed a uterus measuring 8.8 x 3.1 x 4.3 cm.  There is slight heterogeneous appearance of the uterus which may be related to adenomyosis.  The endometrium was 4 mm in thickness.  The right ovary measured 3.4 x 1.7 x 1.1 cm.  It was normal in appearance with no masses and no soft tissue lesions was seen on the ultrasound scan.  The left ovary was grossly normal.  The patient otherwise is treated for hypercholesterolemia and takes Ozempic for weight gain.  She has a history of Graves' disease and is status post thyroidectomy.  Her surgical history is most significant for a diagnostic exploratory laparotomy approximately 20 years prior in [place] for severe right lower quadrant pain that was self-limiting.  The laparotomy was negative for pathologic findings.  Her  gynecologic history is remarkable for 4 prior vaginal deliveries.  She denied history of infertility.  She is never taken oral contraceptive pills.  Her family cancer history is difficult to elicit due to her history of being adopted.  However she does note that her maternal grandmother had leukemia in her 74s.  She is a  stay-at-home mom. She lives with her husband and youngest child who is 75.   Current Meds:  Outpatient Encounter Medications as of 04/23/2020  Medication Sig  . ALPRAZolam (XANAX) 0.5 MG tablet Take 0.5 mg by mouth 3 (three) times daily as needed.   Marland Kitchen escitalopram (LEXAPRO) 5 MG tablet Take 5 mg by mouth daily.  Marland Kitchen ezetimibe (ZETIA) 10 MG tablet Take 10 mg by mouth daily.  . FEROSUL 325 (65 Fe) MG tablet Take 325 mg by mouth daily.  Marland Kitchen levothyroxine (SYNTHROID) 125 MCG tablet Take 125 mcg by mouth daily.  Marland Kitchen OZEMPIC, 1 MG/DOSE, 4 MG/3ML SOPN INJECT 1 PEN-INJECTOR UNDER THE SKIN WEEKLY  . REPATHA SURECLICK 222 MG/ML SOAJ SMARTSIG:1 Milliliter(s) Topical Every 2 Weeks   No facility-administered encounter medications on file as of 04/23/2020.    Allergy:  Allergies  Allergen Reactions  . Ptu [Propylthiouracil] Other (See Comments)    Heart attack, fever  . Codeine Nausea And Vomiting  . Soy Allergy Other (See Comments)    "puffy eyes and I don't feel well"    Social Hx:   Social History   Socioeconomic History  . Marital status: Married    Spouse name: Not on file  . Number of children: Not on file  . Years of education: Not on file  . Highest education level: Not on file  Occupational History  . Not on file  Tobacco Use  . Smoking status: Never Smoker  . Smokeless tobacco: Never Used  Vaping Use  . Vaping Use: Never used  Substance and Sexual Activity  . Alcohol use: No  . Drug use: No  . Sexual activity: Yes    Birth control/protection: None  Other Topics Concern  . Not on file  Social History Narrative  . Not on file   Social Determinants of Health   Financial Resource Strain:   . Difficulty of Paying Living Expenses: Not on file  Food Insecurity:   . Worried About Charity fundraiser in the Last Year: Not on file  . Ran Out of Food in the Last Year: Not on file  Transportation Needs:   . Lack of Transportation (Medical): Not on file  . Lack of Transportation  (Non-Medical): Not on file  Physical Activity:   . Days of Exercise per Week: Not on file  . Minutes of Exercise per Session: Not on file  Stress:   . Feeling of Stress : Not on file  Social Connections:   . Frequency of Communication with Friends and Family: Not on file  . Frequency of Social Gatherings with Friends and Family: Not on file  . Attends Religious Services: Not on file  . Active Member of Clubs or Organizations: Not on file  . Attends Archivist Meetings: Not on file  . Marital Status: Not on file  Intimate Partner Violence:   . Fear of Current or Ex-Partner: Not on file  . Emotionally Abused: Not on file  . Physically Abused: Not on file  . Sexually Abused: Not on file    Past Surgical Hx:  Past Surgical History:  Procedure Laterality Date  . BREAST ENHANCEMENT SURGERY    .  HERNIA REPAIR  08/1972   2 months old  . THYROIDECTOMY N/A 03/03/2016   Procedure: TOTAL THYROIDECTOMY;  Surgeon: Armandina Gemma, MD;  Location: [facility];  Service: General;  Laterality: N/A;  . WISDOM TOOTH EXTRACTION      Past Medical Hx:  Past Medical History:  Diagnosis Date  . Adopted    "patient is adopted"  . Anxiety   . Dysrhythmia    tachycardia  . Hyperlipidemia   . Thyroid disease    Graves disease    Past Gynecological History:  See HPI No LMP recorded. Patient is perimenopausal.  Family Hx:  Family History  Adopted: Yes  Problem Relation Age of Onset  . Leukemia Maternal Grandmother   . Breast cancer Neg Hx   . Colon cancer Neg Hx   . Prostate cancer Neg Hx   . Pancreatic cancer Neg Hx   . Ovarian cancer Neg Hx     Review of Systems:  Constitutional  Feels well,    ENT Normal appearing ears and nares bilaterally Skin/Breast  No rash, sores, jaundice, itching, dryness Cardiovascular  No chest pain, shortness of breath, or edema  Pulmonary  No cough or wheeze.  Gastro Intestinal  No nausea, vomitting, or diarrhoea. No bright red blood per rectum,  no abdominal pain, change in bowel movement, or constipation.  Genito Urinary  No frequency, urgency, dysuria,  Musculo Skeletal  No myalgia, arthralgia, joint swelling or pain  Neurologic  No weakness, numbness, change in gait,  Psychology  No depression, anxiety, insomnia.   Vitals:  Blood pressure 133/84, pulse 74, temperature (!) 96.8 F (36 C), resp. rate 20, height 5\' 4"  (1.626 m), weight 157 lb (71.2 kg), SpO2 100 %.  Physical Exam: WD in NAD Neck  Supple NROM, without any enlargements.  Lymph Node Survey No cervical supraclavicular or inguinal adenopathy Cardiovascular  Well perfused peripheries Lungs  No increased WOB Skin  No rash/lesions/breakdown  Psychiatry  Alert and oriented to person, place, and time  Abdomen  Normoactive bowel sounds, abdomen soft, non-tender and nonobese without evidence of hernia.  Back No CVA tenderness Genito Urinary  Vulva/vagina: Normal external female genitalia.  No lesions. No discharge or bleeding.  Bladder/urethra:  No lesions or masses, well supported bladder  Vagina: normal  Cervix: Normal appearing, no lesions.  Uterus:  Small, mobile, no parametrial involvement or nodularity.  Adnexa: no palpable masses. Rectal  deferred Extremities  No bilateral cyanosis, clubbing or edema.  60 minutes of total time was spent for this patient encounter, including preparation, face-to-face counseling with the patient and coordination of care, review of imaging (results and images), communication with the referring provider and documentation of the encounter.   Thereasa Solo, MD  04/23/2020, 1:25 PM

## 2020-04-23 NOTE — Patient Instructions (Addendum)
Preparing for your Surgery  Plan for surgery on May 09, 2020 with Dr. Everitt Amber and Dr. Annye English at [facility] will be scheduled for a robotic assisted total laparoscopic hysterectomy (removal of the uterus and cervix), right salpingo-oophorectomy (removal of right ovary and fallopian tube).   Pre-operative Testing -You will receive a phone call from presurgical testing at [facility] to arrange for a pre-operative appointment, lab appointment, and COVID test. The COVID test normally happens 3 days prior to the surgery and they ask that you self quarantine after the test up until surgery to decrease chance of exposure.  -Bring your insurance card, copy of an advanced directive if applicable, medication list  -At that visit, you will be asked to sign a consent for a possible blood transfusion in case a transfusion becomes necessary during surgery.  The need for a blood transfusion is rare but having consent is a necessary part of your care.     -You should not be taking blood thinners or aspirin at least ten days prior to surgery unless instructed by your surgeon.  -Do not take supplements such as fish oil (omega 3), red yeast rice, turmeric before your surgery.   Day Before Surgery at [facility] will need to follow the diet recommended by Dr. Dema Severin. You will be advised you can have clear liquids up until 3 hours before your surgery.    Your role in recovery Your role is to become active as soon as directed by your doctor, while still giving yourself time to heal.  Rest when you feel tired. You will be asked to do the following in order to speed your recovery:  - Cough and breathe deeply. This helps to clear and expand your lungs and can prevent pneumonia after surgery.  - [place]. Do mild physical activity. Walking or moving your legs help your circulation and body functions return to normal. Do not try to get up or walk alone the first time  after surgery.   -If you develop swelling on one leg or the other, pain in the back of your leg, redness/warmth in one of your legs, please call the office or go to the Emergency Room to have a doppler to rule out a blood clot. For shortness of breath, chest pain-seek care in the Emergency Room as soon as possible. - Actively manage your pain. Managing your pain lets you move in comfort. We will ask you to rate your pain on a scale of zero to 10. It is your responsibility to tell your doctor or nurse where and how much you hurt so your pain can be treated.  Special Considerations -If you are diabetic, you may be placed on insulin after surgery to have closer control over your blood sugars to promote healing and recovery.  This does not mean that you will be discharged on insulin.  If applicable, your oral antidiabetics will be resumed when you are tolerating a solid diet.  -Your final pathology results from surgery should be available around one week after surgery and the results will be relayed to you when available.  -FMLA forms can be faxed to [phone] and please allow 5-7 business days for completion.  Risks of Surgery Risks of surgery are low but include bleeding, infection, damage to surrounding structures, re-operation, blood clots, and very rarely death.   Blood Transfusion Information (For the consent to be signed before surgery)  We will be checking your  blood type before surgery so in case of emergencies, we will know what type of blood you would need.                                            WHAT IS A BLOOD TRANSFUSION?  A transfusion is the replacement of blood or some of its parts. Blood is made up of multiple cells which provide different functions.  Red blood cells carry oxygen and are used for blood loss replacement.  White blood cells fight against infection.  Platelets control bleeding.  Plasma helps clot blood.  Other blood products are available for  specialized needs, such as hemophilia or other clotting disorders. BEFORE THE TRANSFUSION  Who gives blood for transfusions?   You may be able to donate blood to be used at a later date on yourself (autologous donation).  Relatives can be asked to donate blood. This is generally not any safer than if you have received blood from a stranger. The same precautions are taken to ensure safety when a relative's blood is donated.  Healthy volunteers who are fully evaluated to make sure their blood is safe. This is blood bank blood. Transfusion therapy is the safest it has ever been in the practice of medicine. Before blood is taken from a donor, a complete history is taken to make sure that person has no history of diseases nor engages in risky social behavior (examples are intravenous drug use or sexual activity with multiple partners). The donor's travel history is screened to minimize risk of transmitting infections, such as malaria. The donated blood is tested for signs of infectious diseases, such as HIV and hepatitis. The blood is then tested to be sure it is compatible with you in order to minimize the chance of a transfusion reaction. If you or a relative donates blood, this is often done in anticipation of surgery and is not appropriate for emergency situations. It takes many days to process the donated blood. RISKS AND COMPLICATIONS Although transfusion therapy is very safe and saves many lives, the main dangers of transfusion include:   Getting an infectious disease.  Developing a transfusion reaction. This is an allergic reaction to something in the blood you were given. Every precaution is taken to prevent this. The decision to have a blood transfusion has been considered carefully by your caregiver before blood is given. Blood is not given unless the benefits outweigh the risks.  AFTER SURGERY INSTRUCTIONS  Return to work: 4 weeks if applicable  Activity: 1. Be up and out of the bed  during the day.  Take a nap if needed.  You may walk up steps but be careful and use the hand rail.  Stair climbing will tire you more than you think, you may need to stop part way and rest.   2. No lifting or straining for 6 weeks over 10 pounds. No pushing, pulling, straining for 6 weeks.  3. No driving for 1 week(s).  Do not drive if you are taking narcotic pain medicine and make sure that your reaction time has returned.   4. You can shower as soon as the next day after surgery. Shower daily.  Use your regular soap and water (not directly on the incision) and pat your incision(s) dry afterwards; don't rub.  No tub baths or submerging your body in water until cleared by your surgeon.  If you have the soap that was given to you by pre-surgical testing that was used before surgery, you do not need to use it afterwards because this can irritate your incisions.   5. No sexual activity and nothing in the vagina for 8 weeks.  6. You may experience a small amount of clear drainage from your incisions, which is normal.  If the drainage persists, increases, or changes color please call the office.  7. Do not use creams, lotions, or ointments such as neosporin on your incisions after surgery until advised by your surgeon because they can cause removal of the dermabond glue on your incisions.    8. You may experience vaginal spotting after surgery or around the 6-8 week mark from surgery when the stitches at the top of the vagina begin to dissolve.  The spotting is normal but if you experience heavy bleeding, call our office.  9. Take Tylenol or ibuprofen first for pain and only use narcotic pain medication for severe pain not relieved by the Tylenol or Ibuprofen.  Monitor your Tylenol intake to a max of 4,000 mg in a 24 hour period. You can alternate these medications after surgery.  Diet: 1. Low sodium Heart Healthy Diet is recommended but you are cleared to resume your normal (before surgery) diet after  your procedure.  2. It is safe to use a laxative, such as Miralax or Colace, if you have difficulty moving your bowels.   Wound Care: 1. Keep clean and dry.  Shower daily.  Reasons to call the Doctor:  Fever - Oral temperature greater than 100.4 degrees Fahrenheit  Foul-smelling vaginal discharge  Difficulty urinating  Nausea and vomiting  Increased pain at the site of the incision that is unrelieved with pain medicine.  Difficulty breathing with or without chest pain  New calf pain especially if only on one side  Sudden, continuing increased vaginal bleeding with or without clots.   Contacts: For questions or concerns you should contact:  Dr. Everitt Amber at [phone]  Joylene John, NP at [phone]  After Hours: call [phone] and have the GYN Oncologist paged/contacted (after 5 pm or on the weekends)

## 2020-04-25 ENCOUNTER — Other Ambulatory Visit: Payer: Self-pay | Admitting: Gynecologic Oncology

## 2020-04-25 DIAGNOSIS — R1900 Intra-abdominal and pelvic swelling, mass and lump, unspecified site: Secondary | ICD-10-CM

## 2020-05-03 ENCOUNTER — Ambulatory Visit: Payer: Self-pay | Admitting: Surgery

## 2020-05-03 NOTE — H&P (View-Only) (Signed)
CC: Referred by GI for newly diagnosed appendiceal lesion  HPI: Joanne Hunt is a very pleasant 89yoF history of Graves' disease referred to [place] for evaluation of a lesion found on her appendix. She does have a history of "exploratory" surgery through a Pfannenstiel incision was done in  approximately 20 years ago. She reports that there were no pertinent findings. She reports at that time she was having vague intermittent pelvic pains that were associated with painful cramps during periods. She has had those issues since. She is currently perimenopausal and having a period every 2-3 months. She denies any blood in her stool. She had a colonoscopy done 03/09/20 with Joanne Hunt they demonstrated normal terminal ileum and ileocecal valve. One submucosal nodule at the appendiceal orifice that was thought to be a potentially inverted portion of appendix. This was biopsied and returned with benign colonic mucosa with lymphoid aggregates. Single diverticulum in the cecum also found. She had a CT scan completed of her abdomen and pelvis which showed a small enhancing soft tissue lesion associated the base of the cecum and the appendix. Possibility of appendiceal neoplasm not excluded. Did not have imaging characteristics indicative of mucocele. Alternatively, possibility of endometriosis was raised as it is very intimately associated with the right ovary. She had a pelvic ultrasound completed 04/11/20 slight heterogeneity of the uterus but unremarkable ovaries.  PMH: Graves but now s/p thyroidectomy (Joanne Hunt)  PSH: Thyroidectomy; groin hernia repaired as an infant; "exploratory" surgery 20 years ago in 45 through a Pfannenstiel incision that was reportedly unremarkable.  FHx: Unknown as she is adopted. She is here today with her husband  Social: Denies use of tobacco/EtOH/drugs  ROS: A comprehensive 10 system review of systems was completed with the patient and pertinent  findings as noted above.  The patient is a 49 year old female.   Allergies (Joanne Hunt, [place]; 04/16/2020 1:40 PM) Codeine Sulfate *ANALGESICS - OPIOID*  Allergies Reconciled   Medication History (Joanne Hunt, [place]; 04/16/2020 1:40 PM) Synthroid (125MCG Tablet, Oral) Active. FeroSul (325 (65 Fe)MG Tablet, Oral) Active. Ozempic (1 MG/DOSE) (4MG /3ML Soln Pen-inj, Subcutaneous) Active. Repatha (140MG /ML Soln Pref Syr, Subcutaneous) Active. Medications Reconciled    Review of Systems Joanne Hunt; 04/16/2020 2:15 PM) General Not Present- Appetite Loss, Chills, Fatigue, Fever, Night Sweats and Weight Loss. Skin Not Present- Change in Wart/Mole, Dryness, Hives, Jaundice, New Lesions, Non-Healing Wounds, Rash and Ulcer. HEENT Present- Hoarseness and Sore Throat. Not Present- Earache, Hearing Loss, Nose Bleed, Oral Ulcers, Ringing in the Ears, Seasonal Allergies, Sinus Pain, Visual Disturbances, Wears glasses/contact lenses and Yellow Eyes. Respiratory Not Present- Bloody sputum, Chronic Cough, Difficulty Breathing, Snoring and Wheezing. Breast Not Present- Breast Mass, Breast Pain, Nipple Discharge and Skin Changes. Cardiovascular Present- Rapid Heart Rate and Shortness of Breath. Not Present- Chest Pain, Difficulty Breathing Lying Down, Leg Cramps, Palpitations and Swelling of Extremities. Gastrointestinal Not Present- Abdominal Pain, Bloating, Bloody Stool, Change in Bowel Habits, Chronic diarrhea, Constipation, Difficulty Swallowing, Excessive gas, Gets full quickly at meals, Hemorrhoids, Indigestion, Nausea, Rectal Pain and Vomiting. Female Genitourinary Present- Frequency. Not Present- Nocturia, Painful Urination, Pelvic Pain and Urgency. Musculoskeletal Not Present- Back Pain, Joint Pain, Joint Stiffness, Muscle Pain, Muscle Weakness and Swelling of Extremities. Neurological Present- Headaches and Tremor. Not Present- Decreased Memory, Fainting, Numbness,  Seizures, Tingling, Trouble walking and Weakness. Psychiatric Present- Anxiety, Change in Sleep Pattern, Depression, Fearful and Frequent crying. Not Present- Bipolar. Endocrine Present- Excessive Hunger, Heat Intolerance and Hot flashes. Not Present-  Cold Intolerance, Hair Changes and New Diabetes. Hematology Not Present- Blood Thinners, Easy Bruising, Excessive bleeding, Gland problems, HIV and Persistent Infections.  Vitals (Joanne Hunt; 04/16/2020 1:41 PM) 04/16/2020 1:41 PM Weight: 158.2 lb Height: 64in Body Surface Area: 1.77 m Body Mass Index: 27.15 kg/m  Temp.: 98.74F  Pulse: 105 (Regular)  BP: 126/82(Sitting, Left Arm, Standard)       Physical Exam Joanne Hunt; 04/16/2020 2:15 PM) The physical exam findings are as follows: Note: Constitutional: No acute distress; conversant; no deformities; wearing mask and hat Eyes: Moist conjunctiva; no lid lag; anicteric sclerae; pupils equal and round Neck: Trachea midline; no palpable thyromegaly Lungs: Normal respiratory effort; no tactile fremitus CV: rrr; no palpable thrill; no pitting edema GI: Abdomen soft, nontender, nondistended; no palpable hepatosplenomegaly MSK: Normal gait; no clubbing/cyanosis Psychiatric: Appropriate affect; alert and oriented 3 Lymphatic: No palpable cervical or axillary lymphadenopathy    Assessment & Plan Joanne Hunt; 04/16/2020 2:19 PM) MASS OF APPENDIX (K38.8)  Joanne Hunt is a very pleasant 8yoF with possible appendiceal mass - intimately involving right ovary as well - unclear if 2/2 appendiceal process, endometriosis or other causes  -The anatomy and physiology of the GI tract was discussed at length with the patient. The pathophysiology of appendiceal lesions was discussed at length with associated pictures. -Given associations right ovary and potential for endometriosis, we discussed combo case with GYN. I placed a referral for her to visit  with Dr. Denman George. She is perimenopausal and would be interested in potentially having both of her ovaries removed concurrently and potentially her uterus. -We discussed laparoscopic vs robotic appendectomy (based on gyn approach), possible ileocecectomy based on intraoperative findings and if cecum involved in this process. -The planned procedure, material risks (including, but not limited to, pain, bleeding, infection, scarring, need for blood transfusion, damage to surrounding structures- blood vessels/nerves/viscus/organs, damage to ureter, urine leak, leak from anastomosis and what that can entail, need for additional procedures, worsening of pre-existing medical conditions, hernia, recurrence, DVT/PE, pneumonia, heart attack, stroke, death) benefits and alternatives to surgery were discussed at length. The patient's and her husband's questions were answered to their satisfaction, they voiced understanding and elected to proceed with surgery. Additionally, we discussed typical postoperative expectations and the recovery process.  This patient encounter took 45 minutes today to perform the following: take history, perform exam, review outside records, interpret imaging, counsel the patient on their diagnosis and document encounter, findings & plan in the EHR  Signed by Ileana Roup, Hunt (04/16/2020 2:19 PM)

## 2020-05-03 NOTE — H&P (Signed)
CC: Referred by GI for newly diagnosed appendiceal lesion  HPI: Joanne Hunt is a very pleasant 77yoF history of Graves' disease referred to [place] for evaluation of a lesion found on her appendix. She does have a history of "exploratory" surgery through a Pfannenstiel incision was done in [place] approximately 20 years ago. She reports that there were no pertinent findings. She reports at that time she was having vague intermittent pelvic pains that were associated with painful cramps during periods. She has had those issues since. She is currently perimenopausal and having a period every 2-3 months. She denies any blood in her stool. She had a colonoscopy done 03/09/20 with Dr. Rush Landmark they demonstrated normal terminal ileum and ileocecal valve. One submucosal nodule at the appendiceal orifice that was thought to be a potentially inverted portion of appendix. This was biopsied and returned with benign colonic mucosa with lymphoid aggregates. Single diverticulum in the cecum also found. She had a CT scan completed of her abdomen and pelvis which showed a small enhancing soft tissue lesion associated the base of the cecum and the appendix. Possibility of appendiceal neoplasm not excluded. Did not have imaging characteristics indicative of mucocele. Alternatively, possibility of endometriosis was raised as it is very intimately associated with the right ovary. She had a pelvic ultrasound completed 04/11/20 slight heterogeneity of the uterus but unremarkable ovaries.  PMH: Graves but now s/p thyroidectomy (Dr. Harlow Asa)  PSH: Thyroidectomy; groin hernia repaired as an infant; "exploratory" surgery 20 years ago in 60 through a Pfannenstiel incision that was reportedly unremarkable.  FHx: Unknown as she is adopted. She is here today with her husband  Social: Denies use of tobacco/EtOH/drugs  ROS: A comprehensive 10 system review of systems was completed with the patient and pertinent  findings as noted above.  The patient is a 49 year old female.   Allergies (Joanne Hunt, Joanne Hunt; 04/16/2020 1:40 PM) Codeine Sulfate *ANALGESICS - OPIOID*  Allergies Reconciled   Medication History (Joanne Hunt, Joanne Hunt; 04/16/2020 1:40 PM) Synthroid (125MCG Tablet, Oral) Active. FeroSul (325 (65 Fe)MG Tablet, Oral) Active. Ozempic (1 MG/DOSE) (4MG /3ML Soln Pen-inj, Subcutaneous) Active. Repatha (140MG /ML Soln Pref Syr, Subcutaneous) Active. Medications Reconciled    Review of Systems Joanne Hunt; 04/16/2020 2:15 PM) General Not Present- Appetite Loss, Chills, Fatigue, Fever, Night Sweats and Weight Loss. Skin Not Present- Change in Wart/Mole, Dryness, Hives, Jaundice, New Lesions, Non-Healing Wounds, Rash and Ulcer. HEENT Present- Hoarseness and Sore Throat. Not Present- Earache, Hearing Loss, Nose Bleed, Oral Ulcers, Ringing in the Ears, Seasonal Allergies, Sinus Pain, Visual Disturbances, Wears glasses/contact lenses and Yellow Eyes. Respiratory Not Present- Bloody sputum, Chronic Cough, Difficulty Breathing, Snoring and Wheezing. Breast Not Present- Breast Mass, Breast Pain, Nipple Discharge and Skin Changes. Cardiovascular Present- Rapid Heart Rate and Shortness of Breath. Not Present- Chest Pain, Difficulty Breathing Lying Down, Leg Cramps, Palpitations and Swelling of Extremities. Gastrointestinal Not Present- Abdominal Pain, Bloating, Bloody Stool, Change in Bowel Habits, Chronic diarrhea, Constipation, Difficulty Swallowing, Excessive gas, Gets full quickly at meals, Hemorrhoids, Indigestion, Nausea, Rectal Pain and Vomiting. Female Genitourinary Present- Frequency. Not Present- Nocturia, Painful Urination, Pelvic Pain and Urgency. Musculoskeletal Not Present- Back Pain, Joint Pain, Joint Stiffness, Muscle Pain, Muscle Weakness and Swelling of Extremities. Neurological Present- Headaches and Tremor. Not Present- Decreased Memory, Fainting, Numbness,  Seizures, Tingling, Trouble walking and Weakness. Psychiatric Present- Anxiety, Change in Sleep Pattern, Depression, Fearful and Frequent crying. Not Present- Bipolar. Endocrine Present- Excessive Hunger, Heat Intolerance and Hot flashes. Not Present-  Cold Intolerance, Hair Changes and New Diabetes. Hematology Not Present- Blood Thinners, Easy Bruising, Excessive bleeding, Gland problems, HIV and Persistent Infections.  Vitals (Joanne Hunt; 04/16/2020 1:41 PM) 04/16/2020 1:41 PM Weight: 158.2 lb Height: 64in Body Surface Area: 1.77 m Body Mass Index: 27.15 kg/m  Temp.: 98.41F  Pulse: 105 (Regular)  BP: 126/82(Sitting, Left Arm, Standard)       Physical Exam Joanne Hunt; 04/16/2020 2:15 PM) The physical exam findings are as follows: Note: Constitutional: No acute distress; conversant; no deformities; wearing mask and hat Eyes: Moist conjunctiva; no lid lag; anicteric sclerae; pupils equal and round Neck: Trachea midline; no palpable thyromegaly Lungs: Normal respiratory effort; no tactile fremitus CV: rrr; no palpable thrill; no pitting edema GI: Abdomen soft, nontender, nondistended; no palpable hepatosplenomegaly MSK: Normal gait; no clubbing/cyanosis Psychiatric: Appropriate affect; alert and oriented 3 Lymphatic: No palpable cervical or axillary lymphadenopathy    Assessment & Plan Joanne Hunt; 04/16/2020 2:19 PM) MASS OF APPENDIX (K38.8)  Ms. Minassian is a very pleasant 67yoF with possible appendiceal mass - intimately involving right ovary as well - unclear if 2/2 appendiceal process, endometriosis or other causes  -The anatomy and physiology of the GI tract was discussed at length with the patient. The pathophysiology of appendiceal lesions was discussed at length with associated pictures. -Given associations right ovary and potential for endometriosis, we discussed combo case with GYN. I placed a referral for her to visit  with Joanne Hunt. She is perimenopausal and would be interested in potentially having both of her ovaries removed concurrently and potentially her uterus. -We discussed laparoscopic vs robotic appendectomy (based on gyn approach), possible ileocecectomy based on intraoperative findings and if cecum involved in this process. -The planned procedure, material risks (including, but not limited to, pain, bleeding, infection, scarring, need for blood transfusion, damage to surrounding structures- blood vessels/nerves/viscus/organs, damage to ureter, urine leak, leak from anastomosis and what that can entail, need for additional procedures, worsening of pre-existing medical conditions, hernia, recurrence, DVT/PE, pneumonia, heart attack, stroke, death) benefits and alternatives to surgery were discussed at length. The patient's and her husband's questions were answered to their satisfaction, they voiced understanding and elected to proceed with surgery. Additionally, we discussed typical postoperative expectations and the recovery process.  This patient encounter took 45 minutes today to perform the following: take history, perform exam, review outside records, interpret imaging, counsel the patient on their diagnosis and document encounter, findings & plan in the EHR  Signed by Joanne Roup, Hunt (04/16/2020 2:19 PM)

## 2020-05-03 NOTE — Progress Notes (Signed)
DUE TO COVID-19 ONLY ONE VISITOR IS ALLOWED TO COME WITH YOU AND STAY IN THE WAITING ROOM ONLY DURING PRE OP AND PROCEDURE DAY OF SURGERY. THE 1 VISITOR  MAY VISIT WITH YOU AFTER SURGERY IN YOUR PRIVATE ROOM DURING VISITING HOURS ONLY!  YOU NEED TO HAVE A COVID 19 TEST ON__12/18/2021 _____ @_______ , THIS TEST MUST BE DONE BEFORE SURGERY,  COVID TESTING SITE [address] [place] [address], IT IS ON THE RIGHT GOING OUT [address] APPROXIMATELY  2 MINUTES PAST [employer] ON THE RIGHT. ONCE YOUR COVID TEST IS COMPLETED,  PLEASE BEGIN THE QUARANTINE INSTRUCTIONS AS OUTLINED IN YOUR HANDOUT.                Joanne Hunt  05/03/2020   Your procedure is scheduled on: 05/09/2020    Report to [facility] Main  Entrance   Report to admitting at     0545AM  AM     Call this number if you have problems the morning of surgery [phone]    Remember: Do not eat food , candy gum or mints :After Midnight. You may have clear liquids from midnight until 0445AM     CLEAR LIQUID DIET   Foods Allowed                                                                       Coffee and tea, regular and decaf                              Plain Jell-O any favor except red or purple                                            Fruit ices (not with fruit pulp)                                      Iced Popsicles                                     Carbonated beverages, regular and diet                                    Cranberry, grape and apple juices Sports drinks like Gatorade Lightly seasoned clear broth or consume(fat free) Sugar, honey syrup   _____________________________________________________________________    BRUSH YOUR TEETH MORNING OF SURGERY AND RINSE YOUR MOUTH OUT, NO CHEWING GUM CANDY OR MINTS.     Take these medicines the morning of surgery with A SIP OF WATER: lEXAPRO, xANAX I FNEEDED, SYNTHROID   DO NOT TAKE ANY DIABETIC MEDICATIONS DAY OF  YOUR SURGERY                               You may  not have any metal on your body including hair pins and              piercings  Do not wear jewelry, make-up, lotions, powders or perfumes, deodorant             Do not wear nail polish on your fingernails.  Do not shave  48 hours prior to surgery.              Men may shave face and neck.   Do not bring valuables to the hospital. [place].  Contacts, dentures or bridgework may not be worn into surgery.  Leave suitcase in the car. After surgery it may be brought to your room.     Patients discharged the day of surgery will not be allowed to drive home. IF YOU ARE HAVING SURGERY AND GOING HOME THE SAME DAY, YOU MUST HAVE AN ADULT TO DRIVE YOU HOME AND BE WITH YOU FOR 24 HOURS. YOU MAY GO HOME BY TAXI OR UBER OR ORTHERWISE, BUT AN ADULT MUST ACCOMPANY YOU HOME AND STAY WITH YOU FOR 24 HOURS.  Name and phone number of your driver:  Special Instructions: N/A              Please read over the following fact sheets you were given: _____________________________________________________________________  [facility] - Preparing for Surgery Before surgery, you can play an important role.  Because skin is not sterile, your skin needs to be as free of germs as possible.  You can reduce the number of germs on your skin by washing with CHG (chlorahexidine gluconate) soap before surgery.  CHG is an antiseptic cleaner which kills germs and bonds with the skin to continue killing germs even after washing. Please DO NOT use if you have an allergy to CHG or antibacterial soaps.  If your skin becomes reddened/irritated stop using the CHG and inform your nurse when you arrive at Short Stay. Do not shave (including legs and underarms) for at least 48 hours prior to the first CHG shower.  You may shave your face/neck. Please follow these instructions carefully:  1.  Shower with CHG Soap the night before surgery and  the  morning of Surgery.  2.  If you choose to wash your hair, wash your hair first as usual with your  normal  shampoo.  3.  After you shampoo, rinse your hair and body thoroughly to remove the  shampoo.                           4.  Use CHG as you would any other liquid soap.  You can apply chg directly  to the skin and wash                       Gently with a scrungie or clean washcloth.  5.  Apply the CHG Soap to your body ONLY FROM THE NECK DOWN.   Do not use on face/ open                           Wound or open sores. Avoid contact with eyes, ears mouth and genitals (private parts).  Wash face,  Genitals (private parts) with your normal soap.             6.  Wash thoroughly, paying special attention to the area where your surgery  will be performed.  7.  Thoroughly rinse your body with warm water from the neck down.  8.  DO NOT shower/wash with your normal soap after using and rinsing off  the CHG Soap.                9.  Pat yourself dry with a clean towel.            10.  Wear clean pajamas.            11.  Place clean sheets on your bed the night of your first shower and do not  sleep with pets. Day of Surgery : Do not apply any lotions/deodorants the morning of surgery.  Please wear clean clothes to the hospital/surgery center.  FAILURE TO FOLLOW THESE INSTRUCTIONS MAY RESULT IN THE CANCELLATION OF YOUR SURGERY PATIENT SIGNATURE_________________________________  NURSE SIGNATURE__________________________________  ________________________________________________________________________

## 2020-05-04 ENCOUNTER — Telehealth: Payer: Self-pay

## 2020-05-04 ENCOUNTER — Other Ambulatory Visit: Payer: Self-pay

## 2020-05-04 ENCOUNTER — Encounter (HOSPITAL_COMMUNITY)
Admission: RE | Admit: 2020-05-04 | Discharge: 2020-05-04 | Disposition: A | Discharge status: Home / Self Care | Payer: 59 | Source: Ambulatory Visit | Attending: Surgery | Admitting: Surgery

## 2020-05-04 ENCOUNTER — Encounter (HOSPITAL_COMMUNITY): Payer: Self-pay

## 2020-05-04 DIAGNOSIS — Z01818 Encounter for other preprocedural examination: Secondary | ICD-10-CM | POA: Diagnosis present

## 2020-05-04 HISTORY — DX: Essential (primary) hypertension: I10

## 2020-05-04 LAB — BASIC METABOLIC PANEL
Anion gap: 10 (ref 5–15)
BUN: 12 mg/dL (ref 6–20)
CO2: 27 mmol/L (ref 22–32)
Calcium: 8.9 mg/dL (ref 8.9–10.3)
Chloride: 103 mmol/L (ref 98–111)
Creatinine, Ser: 0.87 mg/dL (ref 0.44–1.00)
GFR, Estimated: >60 mL/min (ref >60)
Glucose, Bld: 85 mg/dL (ref 70–99)
Potassium: 3.9 mmol/L (ref 3.5–5.1)
Sodium: 140 mmol/L (ref 135–145)

## 2020-05-04 LAB — CBC
HCT: 44.5 % (ref 36.0–46.0)
Hemoglobin: 15.1 g/dL — ABNORMAL HIGH (ref 12.0–15.0)
MCH: 31.1 pg (ref 26.0–34.0)
MCHC: 33.9 g/dL (ref 30.0–36.0)
MCV: 91.8 fL (ref 80.0–100.0)
Platelets: 325 10*3/uL (ref 150–400)
RBC: 4.85 MIL/uL (ref 3.87–5.11)
RDW: 12.6 % (ref 11.5–15.5)
WBC: 4.6 10*3/uL (ref 4.0–10.5)
nRBC: 0 % (ref 0.0–0.2)

## 2020-05-04 LAB — HEMOGLOBIN A1C
Hgb A1c MFr Bld: 5.1 % (ref 4.8–5.6)
Mean Plasma Glucose: 99.67 mg/dL

## 2020-05-04 NOTE — Progress Notes (Signed)
Called office of [facility] and spoke with Abigail Butts at office to clarify in regards to ? Bowel prep.  Since preop orders state clear liquids on day of bowel prep.  Pt states she has no instructions for bowel prep.  Abigail Butts at [employer] there are no orders for bowel prep.  Had phone conversation on speaker phone so pt heard conversation and voiced understanding.

## 2020-05-04 NOTE — Telephone Encounter (Signed)
LM for Joanne Hunt to call back to the office to discuss an insurance issue with her surgery on 05-09-20.

## 2020-05-04 NOTE — Progress Notes (Signed)
DRINK 2 PRESURGERY ENSURE DRINKS THE NIGHT BEFORE SURGERY AT  1000 PM AND 1 PRESURGERY DRINK THE DAY OF THE PROCEDURE 3 HOURS PRIOR TO SCHEDULED SURGERY. NO SOLIDS AFTER MIDNIGHT THE DAY PRIOR TO THE SURGERY. NOTHING BY MOUTH EXCEPT CLEAR LIQUIDS UNTIL THREE HOURS PRIOR TO SCHEDULED SURGERY. PLEASE FINISH PRESURGERY ENSURE DRINK PER SURGEON ORDER 3 HOURS PRIOR TO SCHEDULED SURGERY TIME WHICH NEEDS TO BE COMPLETED AT _  0545am ________.

## 2020-05-05 ENCOUNTER — Other Ambulatory Visit (HOSPITAL_COMMUNITY)
Admission: RE | Admit: 2020-05-05 | Discharge: 2020-05-05 | Disposition: A | Discharge status: Home / Self Care | Payer: 59 | Source: Ambulatory Visit | Attending: Surgery | Admitting: Surgery

## 2020-05-05 DIAGNOSIS — Z01812 Encounter for preprocedural laboratory examination: Secondary | ICD-10-CM | POA: Diagnosis present

## 2020-05-05 DIAGNOSIS — Z20822 Contact with and (suspected) exposure to covid-19: Secondary | ICD-10-CM | POA: Diagnosis not present

## 2020-05-05 LAB — SARS CORONAVIRUS 2 (TAT 6-24 HRS): SARS Coronavirus 2: NEGATIVE

## 2020-05-08 ENCOUNTER — Encounter (HOSPITAL_COMMUNITY): Payer: Self-pay | Admitting: Surgery

## 2020-05-08 MED ORDER — BUPIVACAINE LIPOSOME 1.3 % IJ SUSP
20.0000 mL | INTRAMUSCULAR | Status: DC
  Filled 2020-05-08: qty 20

## 2020-05-08 NOTE — Anesthesia Preprocedure Evaluation (Addendum)
Anesthesia Evaluation  Patient identified by MRN, date of birth, ID band Patient awake    Reviewed: Allergy & Precautions, NPO status , Patient's Chart, lab work & pertinent test results  Airway Mallampati: I  TM Distance: >3 FB Neck ROM: Full    Dental  (+) Teeth Intact, Dental Advisory Given   Pulmonary neg pulmonary ROS,    breath sounds clear to auscultation       Cardiovascular hypertension,  Rhythm:Regular Rate:Normal     Neuro/Psych Anxiety negative neurological ROS     GI/Hepatic negative GI ROS, Neg liver ROS,   Endo/Other  Hypothyroidism   Renal/GU negative Renal ROS     Musculoskeletal   Abdominal Normal abdominal exam  (+)   Peds  Hematology   Anesthesia Other Findings   Reproductive/Obstetrics                            Anesthesia Physical Anesthesia Plan  ASA: II  Anesthesia Plan: General   Post-op Pain Management:    Induction: Intravenous  PONV Risk Score and Plan: 4 or greater and Ondansetron, Dexamethasone, Midazolam and Scopolamine patch - Pre-op  Airway Management Planned: Oral ETT  Additional Equipment: None  Intra-op Plan:   Post-operative Plan: Extubation in OR  Informed Consent: I have reviewed the patients History and Physical, chart, labs and discussed the procedure including the risks, benefits and alternatives for the proposed anesthesia with the patient or authorized representative who has indicated his/her understanding and acceptance.     Dental advisory given  Plan Discussed with: CRNA  Anesthesia Plan Comments:        Anesthesia Quick Evaluation

## 2020-05-08 NOTE — Telephone Encounter (Signed)
Told Joanne Hunt that the insurance will not cover the hysterectomy portion of the procedure.  Will schedule her for a Robo RSO with possible hysterectomy if warranted at the time of surgery, then  Dr. Berline Lopes would proceed with the hysterectomy. Pt verbalized understanding.  Joanne Hunt stated that she understands her written pre op instructions and has no questions at this time.

## 2020-05-09 ENCOUNTER — Ambulatory Visit (HOSPITAL_COMMUNITY): Payer: 59 | Admitting: Certified Registered Nurse Anesthetist

## 2020-05-09 ENCOUNTER — Ambulatory Visit (HOSPITAL_COMMUNITY)
Admission: RE | Admit: 2020-05-09 | Discharge: 2020-05-09 | Disposition: A | Discharge status: Home / Self Care | Payer: 59 | Attending: Surgery | Admitting: Surgery

## 2020-05-09 ENCOUNTER — Encounter (HOSPITAL_COMMUNITY): Payer: Self-pay | Admitting: Surgery

## 2020-05-09 ENCOUNTER — Encounter (HOSPITAL_COMMUNITY)
Admission: RE | Disposition: A | Discharge status: Home / Self Care | Payer: Self-pay | Source: Home / Self Care | Attending: Surgery

## 2020-05-09 DIAGNOSIS — N920 Excessive and frequent menstruation with regular cycle: Secondary | ICD-10-CM | POA: Diagnosis not present

## 2020-05-09 DIAGNOSIS — N9489 Other specified conditions associated with female genital organs and menstrual cycle: Secondary | ICD-10-CM

## 2020-05-09 DIAGNOSIS — E7800 Pure hypercholesterolemia, unspecified: Secondary | ICD-10-CM | POA: Diagnosis not present

## 2020-05-09 DIAGNOSIS — K388 Other specified diseases of appendix: Secondary | ICD-10-CM

## 2020-05-09 DIAGNOSIS — N946 Dysmenorrhea, unspecified: Secondary | ICD-10-CM | POA: Diagnosis not present

## 2020-05-09 DIAGNOSIS — N800 Endometriosis of uterus: Secondary | ICD-10-CM

## 2020-05-09 DIAGNOSIS — N808 Other endometriosis: Secondary | ICD-10-CM | POA: Diagnosis not present

## 2020-05-09 DIAGNOSIS — Z79890 Hormone replacement therapy (postmenopausal): Secondary | ICD-10-CM | POA: Diagnosis not present

## 2020-05-09 DIAGNOSIS — K389 Disease of appendix, unspecified: Secondary | ICD-10-CM | POA: Diagnosis present

## 2020-05-09 DIAGNOSIS — R1900 Intra-abdominal and pelvic swelling, mass and lump, unspecified site: Secondary | ICD-10-CM

## 2020-05-09 DIAGNOSIS — Z79899 Other long term (current) drug therapy: Secondary | ICD-10-CM | POA: Diagnosis not present

## 2020-05-09 DIAGNOSIS — N939 Abnormal uterine and vaginal bleeding, unspecified: Secondary | ICD-10-CM

## 2020-05-09 DIAGNOSIS — N8003 Adenomyosis of the uterus: Secondary | ICD-10-CM

## 2020-05-09 DIAGNOSIS — N7011 Chronic salpingitis: Secondary | ICD-10-CM | POA: Diagnosis not present

## 2020-05-09 DIAGNOSIS — Z885 Allergy status to narcotic agent status: Secondary | ICD-10-CM | POA: Diagnosis not present

## 2020-05-09 HISTORY — PX: ROBOTIC ASSISTED TOTAL HYSTERECTOMY WITH BILATERAL SALPINGO OOPHERECTOMY: SHX6086

## 2020-05-09 HISTORY — PX: XI ROBOTIC LAPAROSCOPIC ASSISTED APPENDECTOMY: SHX6877

## 2020-05-09 LAB — TYPE AND SCREEN
ABO/RH(D): A POS
Antibody Screen: NEGATIVE

## 2020-05-09 LAB — ABO/RH: ABO/RH(D): A POS

## 2020-05-09 LAB — PREGNANCY, URINE: Preg Test, Ur: NEGATIVE

## 2020-05-09 SURGERY — APPENDECTOMY, ROBOT-ASSISTED, LAPAROSCOPIC
Anesthesia: General | Site: Abdomen | Laterality: Right

## 2020-05-09 MED ORDER — PHENYLEPHRINE 40 MCG/ML (10ML) SYRINGE FOR IV PUSH (FOR BLOOD PRESSURE SUPPORT)
PREFILLED_SYRINGE | INTRAVENOUS | Status: DC | PRN
  Administered 2020-05-09 (×3): 80 ug via INTRAVENOUS

## 2020-05-09 MED ORDER — FENTANYL CITRATE (PF) 100 MCG/2ML IJ SOLN
INTRAMUSCULAR | Status: AC
  Filled 2020-05-09: qty 2

## 2020-05-09 MED ORDER — ALVIMOPAN 12 MG PO CAPS
12.0000 mg | ORAL_CAPSULE | ORAL | Status: AC
  Administered 2020-05-09: 06:00:00 12 mg via ORAL
  Filled 2020-05-09: qty 1

## 2020-05-09 MED ORDER — ONDANSETRON HCL 4 MG/2ML IJ SOLN
INTRAMUSCULAR | Status: DC | PRN
  Administered 2020-05-09: 4 mg via INTRAVENOUS

## 2020-05-09 MED ORDER — ROCURONIUM BROMIDE 10 MG/ML (PF) SYRINGE
PREFILLED_SYRINGE | INTRAVENOUS | Status: DC | PRN
  Administered 2020-05-09: 50 mg via INTRAVENOUS
  Administered 2020-05-09 (×2): 20 mg via INTRAVENOUS

## 2020-05-09 MED ORDER — BUPIVACAINE-EPINEPHRINE (PF) 0.25% -1:200000 IJ SOLN
INTRAMUSCULAR | Status: AC
  Filled 2020-05-09: qty 30

## 2020-05-09 MED ORDER — PROPOFOL 10 MG/ML IV BOLUS
INTRAVENOUS | Status: AC
  Filled 2020-05-09: qty 20

## 2020-05-09 MED ORDER — LACTATED RINGERS IV SOLN: INTRAVENOUS | Status: DC

## 2020-05-09 MED ORDER — LACTATED RINGERS IR SOLN
Status: DC | PRN
  Administered 2020-05-09: 1000 mL

## 2020-05-09 MED ORDER — SUGAMMADEX SODIUM 200 MG/2ML IV SOLN
INTRAVENOUS | Status: DC | PRN
  Administered 2020-05-09: 200 mg via INTRAVENOUS

## 2020-05-09 MED ORDER — BUPIVACAINE-EPINEPHRINE 0.25% -1:200000 IJ SOLN
INTRAMUSCULAR | Status: DC | PRN
  Administered 2020-05-09: 30 mL

## 2020-05-09 MED ORDER — ACETAMINOPHEN 10 MG/ML IV SOLN: 1000.0000 mg | Freq: Once | INTRAVENOUS | Status: DC | PRN

## 2020-05-09 MED ORDER — GABAPENTIN 300 MG PO CAPS
300.0000 mg | ORAL_CAPSULE | ORAL | Status: AC
  Administered 2020-05-09: 06:00:00 300 mg via ORAL
  Filled 2020-05-09: qty 1

## 2020-05-09 MED ORDER — DEXAMETHASONE SODIUM PHOSPHATE 10 MG/ML IJ SOLN
INTRAMUSCULAR | Status: AC
  Filled 2020-05-09: qty 1

## 2020-05-09 MED ORDER — CHLORHEXIDINE GLUCONATE CLOTH 2 % EX PADS: 6.0000 | MEDICATED_PAD | Freq: Once | CUTANEOUS | Status: DC

## 2020-05-09 MED ORDER — DEXAMETHASONE SODIUM PHOSPHATE 4 MG/ML IJ SOLN: 4.0000 mg | INTRAMUSCULAR | Status: DC

## 2020-05-09 MED ORDER — SCOPOLAMINE 1 MG/3DAYS TD PT72
1.0000 | MEDICATED_PATCH | TRANSDERMAL | Status: DC
  Administered 2020-05-09: 06:00:00 1.5 mg via TRANSDERMAL
  Filled 2020-05-09: qty 1

## 2020-05-09 MED ORDER — KETAMINE HCL 10 MG/ML IJ SOLN
INTRAMUSCULAR | Status: DC | PRN
  Administered 2020-05-09: 25 mg via INTRAVENOUS

## 2020-05-09 MED ORDER — CELECOXIB 200 MG PO CAPS
400.0000 mg | ORAL_CAPSULE | ORAL | Status: AC
  Administered 2020-05-09: 06:00:00 400 mg via ORAL
  Filled 2020-05-09: qty 2

## 2020-05-09 MED ORDER — ACETAMINOPHEN 160 MG/5ML PO SOLN: 325.0000 mg | Freq: Once | ORAL | Status: DC | PRN

## 2020-05-09 MED ORDER — LIDOCAINE 2% (20 MG/ML) 5 ML SYRINGE
INTRAMUSCULAR | Status: DC | PRN
  Administered 2020-05-09: 40 mg via INTRAVENOUS

## 2020-05-09 MED ORDER — SODIUM CHLORIDE 0.9 % IV SOLN
2.0000 g | INTRAVENOUS | Status: AC
  Administered 2020-05-09: 08:00:00 2 g via INTRAVENOUS
  Filled 2020-05-09: qty 2

## 2020-05-09 MED ORDER — TRAMADOL HCL 50 MG PO TABS: 50.0000 mg | ORAL_TABLET | Freq: Four times a day (QID) | ORAL | 0 refills | Status: AC | PRN

## 2020-05-09 MED ORDER — PROPOFOL 10 MG/ML IV BOLUS
INTRAVENOUS | Status: DC | PRN
  Administered 2020-05-09: 140 mg via INTRAVENOUS

## 2020-05-09 MED ORDER — ENSURE PRE-SURGERY PO LIQD
296.0000 mL | Freq: Once | ORAL | Status: DC
  Filled 2020-05-09: qty 296

## 2020-05-09 MED ORDER — DEXAMETHASONE SODIUM PHOSPHATE 10 MG/ML IJ SOLN
INTRAMUSCULAR | Status: DC | PRN
  Administered 2020-05-09: 5 mg via INTRAVENOUS

## 2020-05-09 MED ORDER — HEPARIN SODIUM (PORCINE) 5000 UNIT/ML IJ SOLN
5000.0000 [IU] | Freq: Once | INTRAMUSCULAR | Status: AC
  Administered 2020-05-09: 06:00:00 5000 [IU] via SUBCUTANEOUS
  Filled 2020-05-09: qty 1

## 2020-05-09 MED ORDER — BUPIVACAINE LIPOSOME 1.3 % IJ SUSP
INTRAMUSCULAR | Status: DC | PRN
  Administered 2020-05-09: 20 mL

## 2020-05-09 MED ORDER — ACETAMINOPHEN 325 MG PO TABS: 325.0000 mg | ORAL_TABLET | Freq: Once | ORAL | Status: DC | PRN

## 2020-05-09 MED ORDER — LIDOCAINE 2% (20 MG/ML) 5 ML SYRINGE
INTRAMUSCULAR | Status: DC | PRN
  Administered 2020-05-09: 1.5 mg/kg/h via INTRAVENOUS

## 2020-05-09 MED ORDER — LACTATED RINGERS IV SOLN: INTRAVENOUS | Status: DC | PRN

## 2020-05-09 MED ORDER — FENTANYL CITRATE (PF) 100 MCG/2ML IJ SOLN
INTRAMUSCULAR | Status: DC | PRN
  Administered 2020-05-09 (×4): 50 ug via INTRAVENOUS

## 2020-05-09 MED ORDER — ONDANSETRON HCL 4 MG/2ML IJ SOLN
INTRAMUSCULAR | Status: AC
  Filled 2020-05-09: qty 2

## 2020-05-09 MED ORDER — FENTANYL CITRATE (PF) 100 MCG/2ML IJ SOLN
25.0000 ug | INTRAMUSCULAR | Status: DC | PRN
  Administered 2020-05-09: 50 ug via INTRAVENOUS
  Administered 2020-05-09 (×2): 25 ug via INTRAVENOUS
  Administered 2020-05-09: 50 ug via INTRAVENOUS

## 2020-05-09 MED ORDER — ENSURE PRE-SURGERY PO LIQD
592.0000 mL | Freq: Once | ORAL | Status: DC
  Filled 2020-05-09: qty 592

## 2020-05-09 MED ORDER — KETOROLAC TROMETHAMINE 30 MG/ML IJ SOLN
30.0000 mg | Freq: Once | INTRAMUSCULAR | Status: AC
  Administered 2020-05-09: 30 mg via INTRAVENOUS

## 2020-05-09 MED ORDER — AMISULPRIDE (ANTIEMETIC) 5 MG/2ML IV SOLN: 10.0000 mg | Freq: Once | INTRAVENOUS | Status: DC | PRN

## 2020-05-09 MED ORDER — ACETAMINOPHEN 500 MG PO TABS
1000.0000 mg | ORAL_TABLET | ORAL | Status: AC
  Administered 2020-05-09: 06:00:00 1000 mg via ORAL
  Filled 2020-05-09: qty 2

## 2020-05-09 MED ORDER — STERILE WATER FOR IRRIGATION IR SOLN
Status: DC | PRN
  Administered 2020-05-09: 1000 mL

## 2020-05-09 MED ORDER — BUPIVACAINE HCL 0.25 % IJ SOLN
INTRAMUSCULAR | Status: AC
  Filled 2020-05-09: qty 1

## 2020-05-09 MED ORDER — KETAMINE HCL 10 MG/ML IJ SOLN
INTRAMUSCULAR | Status: AC
  Filled 2020-05-09: qty 1

## 2020-05-09 MED ORDER — MIDAZOLAM HCL 5 MG/5ML IJ SOLN
INTRAMUSCULAR | Status: DC | PRN
  Administered 2020-05-09: 2 mg via INTRAVENOUS

## 2020-05-09 MED ORDER — MEPERIDINE HCL 50 MG/ML IJ SOLN: 6.2500 mg | INTRAMUSCULAR | Status: DC | PRN

## 2020-05-09 MED ORDER — MIDAZOLAM HCL 2 MG/2ML IJ SOLN
INTRAMUSCULAR | Status: AC
  Filled 2020-05-09: qty 2

## 2020-05-09 MED ORDER — ROCURONIUM BROMIDE 10 MG/ML (PF) SYRINGE
PREFILLED_SYRINGE | INTRAVENOUS | Status: AC
  Filled 2020-05-09: qty 10

## 2020-05-09 MED ORDER — KETOROLAC TROMETHAMINE 30 MG/ML IJ SOLN
INTRAMUSCULAR | Status: AC
  Filled 2020-05-09: qty 1

## 2020-05-09 SURGICAL SUPPLY — 87 items
ADH SKN CLS APL DERMABOND .7 (GAUZE/BANDAGES/DRESSINGS) ×2
AGENT HMST KT MTR STRL THRMB (HEMOSTASIS)
APL ESCP 34 STRL LF DISP (HEMOSTASIS)
APPLICATOR SURGIFLO ENDO (HEMOSTASIS) IMPLANT
BACTOSHIELD CHG 4% 4OZ (MISCELLANEOUS) ×2
BAG LAPAROSCOPIC 12 15 PORT 16 (BASKET) IMPLANT
BAG RETRIEVAL 12/15 (BASKET)
BAG RETRIEVAL 12/15MM (BASKET)
BAG SPEC RTRVL LRG 6X4 10 (ENDOMECHANICALS)
BLADE SURG SZ10 CARB STEEL (BLADE) IMPLANT
CANNULA REDUC XI 12-8 STAPL (CANNULA) ×3
CANNULA REDUC XI 12-8MM STAPL (CANNULA) ×1
CANNULA REDUCER 12-8 DVNC XI (CANNULA) IMPLANT
COVER BACK TABLE 60X90IN (DRAPES) ×4 IMPLANT
COVER TIP SHEARS 8 DVNC (MISCELLANEOUS) ×2 IMPLANT
COVER TIP SHEARS 8MM DA VINCI (MISCELLANEOUS) ×4
COVER WAND RF STERILE (DRAPES) IMPLANT
DECANTER SPIKE VIAL GLASS SM (MISCELLANEOUS) IMPLANT
DERMABOND ADVANCED (GAUZE/BANDAGES/DRESSINGS) ×2
DERMABOND ADVANCED .7 DNX12 (GAUZE/BANDAGES/DRESSINGS) ×2 IMPLANT
DEVICE TROCAR PUNCTURE CLOSURE (ENDOMECHANICALS) ×2 IMPLANT
DRAPE ARM DVNC X/XI (DISPOSABLE) ×8 IMPLANT
DRAPE COLUMN DVNC XI (DISPOSABLE) ×2 IMPLANT
DRAPE DA VINCI XI ARM (DISPOSABLE) ×16
DRAPE DA VINCI XI COLUMN (DISPOSABLE) ×4
DRAPE SHEET LG 3/4 BI-LAMINATE (DRAPES) ×4 IMPLANT
DRAPE SURG IRRIG POUCH 19X23 (DRAPES) ×4 IMPLANT
DRSG OPSITE POSTOP 4X6 (GAUZE/BANDAGES/DRESSINGS) IMPLANT
DRSG OPSITE POSTOP 4X8 (GAUZE/BANDAGES/DRESSINGS) IMPLANT
ELECT PENCIL ROCKER SW 15FT (MISCELLANEOUS) IMPLANT
ELECT REM PT RETURN 15FT ADLT (MISCELLANEOUS) ×4 IMPLANT
GLOVE BIO SURGEON STRL SZ 6.5 (GLOVE) ×6 IMPLANT
GLOVE BIO SURGEONS STRL SZ 6.5 (GLOVE) ×2
GLOVE SURG ENC MOIS LTX SZ6 (GLOVE) ×16 IMPLANT
GOWN STRL REUS W/ TWL LRG LVL3 (GOWN DISPOSABLE) ×8 IMPLANT
GOWN STRL REUS W/TWL LRG LVL3 (GOWN DISPOSABLE) ×16
HOLDER FOLEY CATH W/STRAP (MISCELLANEOUS) IMPLANT
IRRIG SUCT STRYKERFLOW 2 WTIP (MISCELLANEOUS) ×4
IRRIGATION SUCT STRKRFLW 2 WTP (MISCELLANEOUS) ×2 IMPLANT
KIT PROCEDURE DA VINCI SI (MISCELLANEOUS)
KIT PROCEDURE DVNC SI (MISCELLANEOUS) IMPLANT
KIT TURNOVER KIT A (KITS) IMPLANT
MANIPULATOR UTERINE 4.5 ZUMI (MISCELLANEOUS) ×4 IMPLANT
NDL INSUFFLATION 14GA 120MM (NEEDLE) IMPLANT
NDL SPNL 18GX3.5 QUINCKE PK (NEEDLE) IMPLANT
NEEDLE HYPO 22GX1.5 SAFETY (NEEDLE) ×4 IMPLANT
NEEDLE INSUFFLATION 14GA 120MM (NEEDLE) ×4 IMPLANT
NEEDLE SPNL 18GX3.5 QUINCKE PK (NEEDLE) IMPLANT
OBTURATOR OPTICAL STANDARD 8MM (TROCAR) ×4
OBTURATOR OPTICAL STND 8 DVNC (TROCAR) ×2
OBTURATOR OPTICALSTD 8 DVNC (TROCAR) ×2 IMPLANT
PACK ROBOT GYN CUSTOM WL (TRAY / TRAY PROCEDURE) ×4 IMPLANT
PAD POSITIONING PINK XL (MISCELLANEOUS) ×4 IMPLANT
PORT ACCESS TROCAR AIRSEAL 12 (TROCAR) ×2 IMPLANT
PORT ACCESS TROCAR AIRSEAL 5M (TROCAR) ×2
POUCH SPECIMEN RETRIEVAL 10MM (ENDOMECHANICALS) IMPLANT
RELOAD STAPLE 45 3.5 BLU DVNC (STAPLE) IMPLANT
RELOAD STAPLER 3.5X45 BLU DVNC (STAPLE) ×2 IMPLANT
SCRUB CHG 4% DYNA-HEX 4OZ (MISCELLANEOUS) ×2 IMPLANT
SEAL CANN UNIV 5-8 DVNC XI (MISCELLANEOUS) ×6 IMPLANT
SEAL XI 5MM-8MM UNIVERSAL (MISCELLANEOUS) ×12
SET TRI-LUMEN FLTR TB AIRSEAL (TUBING) ×4 IMPLANT
SPONGE LAP 18X18 RF (DISPOSABLE) IMPLANT
STAPLER 45 DA VINCI SURE FORM (STAPLE) ×4
STAPLER 45 SUREFORM DVNC (STAPLE) IMPLANT
STAPLER CANNULA SEAL DVNC XI (STAPLE) IMPLANT
STAPLER CANNULA SEAL XI (STAPLE) ×4
STAPLER RELOAD 3.5X45 BLU DVNC (STAPLE) ×2
STAPLER RELOAD 3.5X45 BLUE (STAPLE) ×4
SURGIFLO W/THROMBIN 8M KIT (HEMOSTASIS) IMPLANT
SUT MNCRL AB 4-0 PS2 18 (SUTURE) IMPLANT
SUT PDS AB 1 TP1 96 (SUTURE) IMPLANT
SUT VIC AB 0 CT1 27 (SUTURE)
SUT VIC AB 0 CT1 27XBRD ANTBC (SUTURE) IMPLANT
SUT VIC AB 2-0 CT1 27 (SUTURE)
SUT VIC AB 2-0 CT1 TAPERPNT 27 (SUTURE) IMPLANT
SUT VIC AB 4-0 PS2 18 (SUTURE) ×8 IMPLANT
SUT VICRYL 4-0 PS2 18IN ABS (SUTURE) ×2 IMPLANT
SYR 10ML LL (SYRINGE) IMPLANT
SYR 20ML LL LF (SYRINGE) IMPLANT
SYR 50ML LL SCALE MARK (SYRINGE) IMPLANT
TOWEL OR NON WOVEN STRL DISP B (DISPOSABLE) ×4 IMPLANT
TRAP SPECIMEN MUCUS 40CC (MISCELLANEOUS) IMPLANT
TRAY FOLEY MTR SLVR 16FR STAT (SET/KITS/TRAYS/PACK) ×4 IMPLANT
TROCAR XCEL NON-BLD 5MMX100MML (ENDOMECHANICALS) IMPLANT
UNDERPAD 30X36 HEAVY ABSORB (UNDERPADS AND DIAPERS) ×4 IMPLANT
WATER STERILE IRR 1000ML POUR (IV SOLUTION) ×4 IMPLANT

## 2020-05-09 NOTE — Discharge Instructions (Addendum)
05/09/2020  From Dr. Serita Grit procedure:  You had a hysterectomy (removal of the uterus and cervix), removal of both fallopian tubes, and removal of the right ovary. The left ovary was normal and was not removed during surgery. Dr. Dema Severin performed an appendectomy.   Return to work: 2-4 weeks if applicable  Activity: 1. Be up and out of the bed during the day.  Take a nap if needed.  You may walk up steps but be careful and use the hand rail.  Stair climbing will tire you more than you think, you may need to stop part way and rest.   2. No lifting or straining for 6 weeks.  3. No driving for 1 week(s).  Do not drive if you are taking narcotic pain medicine.  4. Shower daily.  Use soap and water on your incision and pat dry; don't rub.  No tub baths until cleared by your surgeon.   5. No sexual activity and nothing in the vagina for 8 weeks since you had a hysterectomy.  6. You may experience a small amount of clear drainage from your incisions, which is normal.  If the drainage persists or increases, please call the office.  7. You may experience vaginal spotting after surgery or around the 6-8 week mark from surgery when the stitches at the top of the vagina begin to dissolve.  The spotting is normal but if you experience heavy bleeding, call our office.  8. Take Tylenol or ibuprofen first for pain and only use narcotic pain medication for severe pain not relieved by the Tylenol or Ibuprofen.  Monitor your Tylenol intake to a max of 4,000 mg.  Diet: 1. Low sodium Heart Healthy Diet is recommended.  2. It is safe to use a laxative, such as Miralax or Colace, if you have difficulty moving your bowels. You can take Sennakot at bedtime every evening to keep bowel movements regular and to prevent constipation.    Wound Care: 1. Keep clean and dry.  Shower daily.  Reasons to call the Doctor:  Fever - Oral temperature greater than 100.4 degrees Fahrenheit  Foul-smelling vaginal  discharge  Difficulty urinating  Nausea and vomiting  Increased pain at the site of the incision that is unrelieved with pain medicine.  Difficulty breathing with or without chest pain  New calf pain especially if only on one side  Sudden, continuing increased vaginal bleeding with or without clots.   Contacts: For questions or concerns you should contact:  Dr. Everitt Amber at [phone]  Joylene John, NP at [phone]  After Hours: call [phone] and have the GYN Oncologist paged/contacted

## 2020-05-09 NOTE — Interval H&P Note (Signed)
History and Physical Interval Note:  05/09/2020 7:23 AM  Joanne Hunt  has presented today for surgery, with the diagnosis of APPENDICEAL LESION, ADENOMYOSIS, RIGHT ADNEXAL MASS.  The various methods of treatment have been discussed with the patient and family. After consideration of risks, benefits and other options for treatment, the patient has consented to  Procedure(s): ROBOTIC POSSIBLE LAPAROSCOPIC APPENDECTOMY (N/A) XI ROBOTIC ASSISTED POSSIBLE  TOTAL HYSTERECTOMY WITH RIGHT  SALPINGO OOPHORECTOMY (Right) as a surgical intervention.  The patient's history has been reviewed, patient examined, no change in status, stable for surgery.  I have reviewed the patient's chart and labs.  Questions were answered to the patient's satisfaction.     [place]

## 2020-05-09 NOTE — Op Note (Addendum)
OPERATIVE NOTE 05/09/20  Surgeon: Donaciano Eva   Assistants: Joylene John, NP (an NP assistant was necessary for tissue manipulation, management of robotic instrumentation, retraction and positioning due to the complexity of the case and hospital policies).   Co-Surgeon: Dr Nadeen Landau  Anesthesia: General endotracheal anesthesia  ASA Class: 2   Pre-operative Diagnosis: right adnexal mass, appendiceal mass, abnormal uterine bleeding.  Post-operative Diagnosis: same  Operation: Robotic-assisted laparoscopic total hysterectomy with bilateral salpingectomy and right oophorectomy.  Anesthesia: GET  Urine Output: 100cc  Operative Findings:  : Adhesions between the omentum and the anterior abdominal wall, adhesions to the appendiceal stump between the omentum and appendiceal mesentery.  The appendiceal omental mass was adherent to the right adnexa and round ligament of the uterus.  There was no recognizable right ovary or tube.  The remainder of the uterus and left tube and ovary were grossly normal.  Hysterectomy was necessary due to the involvement of the cornua of the right side of the uterus with the appendiceal adnexal phlegmon.  Estimated Blood Loss:  less than 50 mL      Total IV Fluids: 800 ml         Specimens: Pelvic washings, appendix with omentum, uterus with cervix bilateral fallopian tubes and right ovary         Complications:  None; patient tolerated the procedure well.         Disposition: PACU - hemodynamically stable.  Procedure Details  The patient was seen in the Holding Room. The risks, benefits, complications, treatment options, and expected outcomes were discussed with the patient.  The patient concurred with the proposed plan, giving informed consent.  The site of surgery properly noted/marked. The patient was identified as Joanne Hunt and the procedure verified as a Robotic-assisted right salpingo oophorectomy with possible hysterectomy,  planned right robotic appendectomy with Dr. Dema Severin was also scheduled. A Time Out was held and the above information confirmed.  After induction of anesthesia, the patient was draped and prepped in the usual sterile manner. Pt was placed in supine position after anesthesia and draped and prepped in the usual sterile manner. The abdominal drape was placed after the CholoraPrep had been allowed to dry for 3 minutes.  Her arms were tucked to her side with all appropriate precautions.  The shoulders were stabilized with padded shoulder blocks applied to the acromium processes.  The patient was placed in the semi-lithotomy position in [place].  The perineum was prepped with Betadine. The patient was then prepped. Foley catheter was placed.  A sterile speculum was placed in the vagina.  The cervix was grasped with a single-tooth tenaculum and dilated with Kennon Rounds dilators.  The ZUMI uterine manipulator with a medium colpotomizer ring was placed without difficulty.  A pneum occluder balloon was placed over the manipulator.  OG tube placement was confirmed and to suction.   Next, a 5 mm skin incision was made 1 cm below the subcostal margin in the midclavicular line.  A Veress needle was inserted and insufflation of the peritoneal cavity with CO2 gas took place in an atraumatic fashion.  The 5 mm Optiview port and scope was used for direct entry.  Opening pressure was under 10 mm CO2.  The abdomen was insufflated and the findings were noted as above.   At this point and all points during the procedure, the patient's intra-abdominal pressure did not exceed 15 mmHg. Next, an 8 mm skin incision was made in the umbilicus and a right  and left port was placed about 10 cm lateral to the robot port on the right and left side.  The left lateral port was measured at 12 mm in order to accommodate the bowel stapler. All ports were placed under direct visualization.  Sharp adhesiolysis took down the filmy adhesions between the  omentum and the anterior abdominal wall at the umbilicus.  The patient was placed in steep Trendelenburg.  Bowel was folded away into the upper abdomen.  The robot was docked in the normal manner.  Please refer to Dr. Orest Dikes operative report regarding the appendectomy and distal omentectomy which was the first portion of the procedure.  In the process of performing this portion of the procedure Dr. Dema Severin dissected the phlegmon from its dense attachments to the right adnexa and right round ligament and cornu of the uterus.  There was no recognizable adnexal structures in the right adnexa.  The right cornua and round ligament were clearly abnormal and inflamed and involved in the inflammatory process.  Therefore after appendectomy was performed a decision was made to proceed with adnexectomy on the right.  In the process of doing this there was bleeding from the cornua due to its involvement in the phlegmon.  Hysterectomy was necessary due to the involvement of the right cornua with the inflammatory process and the bleeding that was encountered during dissection of the right adnexa.  The hysterectomy was started after the round ligament on the right side was incised and the retroperitoneum was entered and the pararectal space was developed.  The ureter was noted to be on the medial leaf of the broad ligament.  The peritoneum above the ureter was incised and stretched and the infundibulopelvic ligament was skeletonized, cauterized and cut.  The posterior peritoneum was taken down to the level of the KOH ring.  The anterior peritoneum was also taken down.  The bladder flap was created to the level of the KOH ring.  The uterine artery on the right side was skeletonized, cauterized and cut in the normal manner.  A similar procedure was performed on the left however on the left the ovary was spared due to her premenopausal status and its normal appearance.  The left fallopian tube however was dissected from the left  ovary and kept with the uterus.  The colpotomy was made and the uterus, cervix, bilateral tubes and right ovary were amputated and delivered through the vagina.  Frozen section of the uterus and right adnexa confirmed no gross or microscopic evidence of malignancy on preliminary evaluation.  Pedicles were inspected and excellent hemostasis was achieved.    The colpotomy at the vaginal cuff was closed with Vicryl on a CT1 needle in a running manner.  Irrigation was used and excellent hemostasis was achieved.  At this point in the procedure was completed.  Robotic instruments were removed under direct visulaization.  The robot was undocked. The LUQ port was closed with Vicryl on a UR-5 needle and the fascia was closed with 0 Vicryl on a UR-5 needle.  The left lateral port was closed under direct visualization with the laparoscopic closure device using 0 Vicryl suture.  A tap block was infiltrated by Dr. Dema Severin into the left abdominal wall under direct visualization to ensure placement of the needle in the transversalis.  The skin was closed with 4-0 Vicryl in a subcuticular manner.  Dermabond was applied.  Sponge, lap and needle counts correct x 2.  The patient was taken to the recovery room in stable  condition.  The vagina was swabbed with  minimal bleeding noted.   All instrument and needle counts were correct x  3.   The patient was transferred to the recovery room in a stable condition.  Donaciano Eva, MD

## 2020-05-09 NOTE — Anesthesia Postprocedure Evaluation (Signed)
Anesthesia Post Note  Patient: Joanne Hunt  Procedure(s) Performed: ROBOTIC  LAPAROSCOPIC APPENDECTOMY AND PARTIAL OMENTECTOMY (N/A Abdomen) XI ROBOTIC ASSISTED   TOTAL HYSTERECTOMY WITH BILATERAL SALPINGO OOPHORECTOMY (Right Abdomen)     Patient location during evaluation: PACU Anesthesia Type: General Level of consciousness: awake and alert Pain management: pain level controlled Vital Signs Assessment: post-procedure vital signs reviewed and stable Respiratory status: spontaneous breathing, nonlabored ventilation, respiratory function stable and patient connected to nasal cannula oxygen Cardiovascular status: blood pressure returned to baseline and stable Postop Assessment: no apparent nausea or vomiting Anesthetic complications: no   No complications documented.  Last Vitals:  Vitals:   05/09/20 1115 05/09/20 1145  BP: (!) 157/83 (!) 157/88  Pulse: 82 79  Resp: 16 16  Temp:  36.4 C  SpO2: 99% 98%    Last Pain:  Vitals:   05/09/20 1115  TempSrc:   PainSc: [place] Nailah Luepke

## 2020-05-09 NOTE — Transfer of Care (Signed)
Immediate Anesthesia Transfer of Care Note  Patient: Joanne Hunt  Procedure(s) Performed: ROBOTIC  LAPAROSCOPIC APPENDECTOMY AND PARTIAL OMENTECTOMY (N/A Abdomen) XI ROBOTIC ASSISTED   TOTAL HYSTERECTOMY WITH BILATERAL SALPINGO OOPHORECTOMY (Right Abdomen)  Patient Location: PACU  Anesthesia Type:General  Level of Consciousness: awake, alert  and patient cooperative  Airway & Oxygen Therapy: Patient Spontanous Breathing and Patient connected to face mask oxygen  Post-op Assessment: Report given to RN and Post -op Vital signs reviewed and stable  Post vital signs: Reviewed and stable  Last Vitals:  Vitals Value Taken Time  BP 175/103 05/09/20 1007  Temp    Pulse 78 05/09/20 1009  Resp 18 05/09/20 1009  SpO2 100 % 05/09/20 1009  Vitals shown include unvalidated device data.  Last Pain:  Vitals:   05/09/20 0634  TempSrc: Oral  PainSc:          Complications: No complications documented.

## 2020-05-09 NOTE — Anesthesia Procedure Notes (Signed)
Procedure Name: Intubation Date/Time: 05/09/2020 7:42 AM Performed by: West Pugh, CRNA Pre-anesthesia Checklist: Patient identified, Emergency Drugs available, Suction available, Patient being monitored and Timeout performed Patient Re-evaluated:Patient Re-evaluated prior to induction Oxygen Delivery Method: Circle system utilized Preoxygenation: Pre-oxygenation with 100% oxygen Induction Type: IV induction Ventilation: Mask ventilation without difficulty Laryngoscope Size: Mac and 3 Grade View: Grade I Tube type: Oral Tube size: 7.0 mm Number of attempts: 1 Airway Equipment and Method: Stylet Placement Confirmation: ETT inserted through vocal cords under direct vision,  positive ETCO2 and breath sounds checked- equal and bilateral Secured at: 21 cm Tube secured with: Tape Dental Injury: Teeth and Oropharynx as per pre-operative assessment

## 2020-05-09 NOTE — Interval H&P Note (Signed)
History and Physical Interval Note:  05/09/2020 7:17 AM  Joanne Hunt  has presented today for surgery, with the diagnosis of APPENDICEAL LESION, ADENOMYOSIS, RIGHT ADNEXAL MASS.  The various methods of treatment have been discussed with the patient and family. After consideration of risks, benefits and other options for treatment, the patient has consented to  Procedure(s): ROBOTIC POSSIBLE LAPAROSCOPIC APPENDECTOMY (N/A) XI ROBOTIC ASSISTED POSSIBLE  TOTAL HYSTERECTOMY WITH RIGHT  SALPINGO OOPHORECTOMY (Right) as a surgical intervention.  The patient's history has been reviewed, patient examined, no change in status, stable for surgery.  I have reviewed the patient's chart and labs.  Questions were answered to the patient's satisfaction.     Thereasa Solo

## 2020-05-09 NOTE — Op Note (Signed)
Joanne Hunt [id number]   PRE-OPERATIVE DIAGNOSIS:  Abnormal appendix, possible endometriosis  POST-OPERATIVE DIAGNOSIS:  Same  Procedure(s): ROBOTIC  LAPAROSCOPIC APPENDECTOMY AND PARTIAL OMENTECTOMY XI ROBOTIC ASSISTED   TOTAL HYSTERECTOMY WITH BILATERAL SALPINGO OOPHORECTOMY  PROCEDURE: 1. Robotic appendectomy 2. Partial omentectomy  Transversus abdominus plane blocks  SURGEON:  Stephanie Coup. Cliffton Asters, MD  CO-SURGEON/ASSISTANT:  Adolphus Birchwood, MD  ANESTHESIA: General endotracheal  EBL:   50 mL  DRAINS: None  SPECIMEN:  Appendix + associated omentum  COUNTS:  Sponge, needle and instrument counts were reported correct x2 at conclusion of the operation  DISPOSITION:  PACU in satisfactory condition  COMPLICATIONS: None  FINDINGS: Base of appendix readily identified at takeoff from cecum where tenia terminated. Omentum covering right tube/ovary/uterus and the appendix disappeared into this tissue. Associated omentum was included. The base was dissected out and divided with a blue load. Ultimately with manipulation of the omentum, everything was freed from the right pelvic side wall and tube/ovary and therefore delivered separately. I was not able to clearly identify a body to her appendix but if it wasn't removed at her prior surgery, would be included in the associated tissue. The right tube/ovary and involved uterus were also clearly 'diseased' and abnormal in appearance.  DESCRIPTION:   The patient was identified & brought into the operating room. SCDs were in place and functioning. She was positioned in lithotomy with Allen stirrups. Pressure points were inspected and padded. General endotracheal anesthesia was administered. Preoperative antibiotics were administered. The patient was positioned supine with arms tucked. A foley catheter was inserted under sterile conditions. The abdomen was prepped and draped in the standard sterile fashion. A surgical timeout confirmed our  plan.  An OG tube was placed by anesthesia to suction.  At Palmer's point, a stab incision was created and the Veress needle introduced into the peritoneal cavity on the first attempt.  Intraperitoneal location was confirmed with the aspiration and saline drop test. Pneumoperitoneum was established to 15 mmHg with CO2.  An optical viewing 5 mm trocar was then placed at this location.  Intraperitoneal inspection demonstrated no evidence of Veress needle or trocar site complications.  This was then exchanged for a 12 mm air seal trocar.  The air seal device was connected.  In the left lower quadrant, a 12 mm blunt tipped robotic trocar was placed under direct visualization.  2 additional 5 mm trochars were placed, one in the infraumbilical position and another in the right lower quadrant.  These were under direct visualization.  Local anesthetic had been infiltrated consisting of a mixture of 0.25% Marcaine and Exparel.  There were some omental containing adhesions in the infraumbilical midline that were carefully taken down sharply.  The abdomen was inspected.  The peritoneal surface was grossly normal.  There was significant adhesions involving the omentum, appendix, right tube/ovary/uterus.  The base the appendix was readily identified where the tinea were traced off of the cecum.  After this, it clearly disappeared into this tissue.  The liver was normal in appearance.  The omentum was normal in appearance with the exception of what was involved with the appendiceal/tube/ovary process. Washings were taken by Dr. Andrey Farmer.  The robot was then docked and I went to the console.  The cecum was normal in appearance as was the ascending colon.  The terminal ileum was identified and normal in appearance.  The omentum was encasing this process in her right lower quadrant.  Beginning with the cecum, the tinea were traced and  the base of the appendix was readily identified.  A window was created just behind the appendix  through the appendiceal mesentery.  Attention was then turned to the area where the body of the appendix would be expected to be located.  It was not readily identified but the omentum was carefully divided layer by layer.  There was no apparent lumen or body appendix identified within this but there is also no other tissue elsewhere that would be a good candidate for any sort of appendix.  It certainly possible that she has had a prior appendectomy with an appendiceal stump remaining.  That being said, there was a significant portion of base of appendix present.  There were no suture materials present.  The omentum was taken layer by layer and a portion of omentum was included with the specimen.  During this process, it did fall away from her tube/ovary.  The robotic stapler was then introduced and using a 40 mm blue load, the base of the appendix was divided including a small cuff of cecum.  The staple line was inspected and noted to be intact and hemostatic.  The appendiceal mesentery was then ligated using bipolar cautery.  This was all then placed into an Endo Catch bag.  The appendiceal mesentery was irrigated and noted to be hemostatic.  The case was turned over at this point to Dr. Denman George.  Please refer to her note for details regarding his portion of the procedure.  After completion of this, during specimen extraction, the appendix and associated omentum were taken out through the vaginal cuff with her hysterectomy.  These were passed off the specimens.  Frozen section of the GYN portion of the procedure did not show any clear evidence of malignancy.  The abdomen was then irrigated and hemostasis was confirmed.  The cecal staple line was healthy and normal in appearance. The left lower quadrant 12 mm trocar site was closed using a laparoscopic suture passer and 0 Vicryl suture. The 8 mm trocars were removed and hemostatic. We then closed the 12 mm air seal port in her upper abdomen using a 0 Vicryl suture.   The fascial defects were palpated and noted to be completely closed.  Sponge, needle, and instrument counts were reported correct x2.  The skin of all port sites was then approximated using 4-0 Monocryl suture. The incisions were covered with Dermabond.  She was taken out of lithotomy, awakened from general anesthesia, extubated, and transferred to a stretcher for transport to PACU in satisfactory condition.

## 2020-05-10 ENCOUNTER — Telehealth: Payer: Self-pay

## 2020-05-10 ENCOUNTER — Encounter (HOSPITAL_COMMUNITY): Payer: Self-pay | Admitting: Surgery

## 2020-05-10 LAB — CYTOLOGY - NON PAP

## 2020-05-10 LAB — SURGICAL PATHOLOGY

## 2020-05-10 NOTE — Telephone Encounter (Signed)
Joanne Hunt sates that she is eating,drinking, and urinating well. Passing gas. Instructed her to get some Miralax and take a capful daily to bid if no BM by tomorrow midday. Afebrile. Incisions D&I She is experiencing some abdominal pain.  Instructed her to take 600 mg of ibuprofen q 6 hrs alt.with 1000 mg of tylenol q 6 hrs. Take 25-50 mg of tramadol every 6 hrs prn if pain level 5/10 or greater on a scale of 0 no pain 10 the worst pain. Pt aware of post op appointments as well as the office number [phone] and after hours number [phone] to call if she has any questions or concerns

## 2020-05-23 ENCOUNTER — Telehealth: Payer: Self-pay

## 2020-05-23 NOTE — Telephone Encounter (Signed)
Told Joanne Hunt that she can have some intermittent light spotting for several weeks after surgery as the vaginal cuff heals and the sutures begin to dissolve.  She needs to call the office if the bleeding increases like a period. Pt verbalized understanding. She has not been lifting > 10 lbs or straining especially with bowel movements

## 2020-05-29 NOTE — Progress Notes (Signed)
Follow-up Note: Gyn-Onc  Consult was requested by Dr. Annye English for the evaluation of Joanne Hunt 50 y.o. female  CC:  Chief Complaint  Patient presents with  . Endometriosis   Assessment/Plan:  Joanne Hunt  is a 50 y.o.  year old with a history of abnormal uterine bleeding, right lower quadrant abdominal pain and s/p robotic assisted total hysterectomy, RSO, left salpingectomy and appendectomy on 05/09/20.  She is doing well postop with no discrete complications.  I believe that her left abdominal pain is secondary to the fascial stitch placed in her left lateral abdominal port site.  I do not feel evidence of a hernia, hematoma or collection in the abdominal wall.  Her symptoms are not consistent with underlying visceral injury.  She will follow-up with Dr. Teresita Madura her gynecologist for routine gynecologic care and with me on a as needed basis for surgical related questions.   HPI: Joanne Hunt is a 50 year old P4 who was seen in consultation at the request of Dr Dema Severin for evaluation of abnormal uterine bleeding and dysmenorrhea in association with an appendiceal (or para-appendiceal mass).  The patient underwent a routine screening colonoscopy in October 2021.  At that time they noted a normal colonic mucosa however the appendiceal orifice appeared inverted.  This led to her being referred to Dr. Dema Severin and a CT scan of the abdomen and pelvis being performed.  This imaging was performed on 03/26/2020 and revealed a focal area of enhancing soft tissue prominence measuring approximate 1.9 x 1.4 x 2 cm in close proximity to the appendix and cecum.  It came in direct contact with the superior aspect of the right ovary.  There was no dilated fluid or mucin filled tubular structure in this region to clearly indicate the presence of a mucocele.  No surrounding periappendiceal inflammatory changes.  Dr. Dema Severin had some concern for the possibility of appendiceal endometriosis.  The  patient reported increasing dysmenorrhea in the preceding year.  She also noted amenorrhea for 2 to 3 months and irregular menstrual cycles.  She had new onset menorrhagia within the preceding 6 months.  She was noting menopausal symptoms of hot flashes.  A transvaginal ultrasound scan was ordered by Dr. Dema Severin and performed on 04/11/2020.  This showed a uterus measuring 8.8 x 3.1 x 4.3 cm.  There is slight heterogeneous appearance of the uterus which may be related to adenomyosis.  The endometrium was 4 mm in thickness.  The right ovary measured 3.4 x 1.7 x 1.1 cm.  It was normal in appearance with no masses and no soft tissue lesions was seen on the ultrasound scan.  The left ovary was grossly normal.  Interval Hx: On 05/09/20 she underwent robotic hysterectomy with bilateral salpingectomy and right oophorectomy and appendectomy. Intraoperative findings were significant for adhesions between the omentum and the anterior abdominal wall, adhesions to the appendiceal stump between the omentum and the appendiceal mesentery.  The appendiceal omental mass was adherent to the right adnexa and round ligament of the uterus.  There was no recognizable right ovary or tube.  The remainder of the uterus and left tube and ovary were grossly normal. Surgery was uncomplicated.  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  There was hydrosalpinx on the right.  The ovary on the right was benign.  Since surgery she did very well initially with no pain in the first 5 days.  She reported that after day 5 she  began experiencing left abdominal port site pain with muscle contraction.  She rates it at about 4 out of 10.  It is not associated with change in abdominal organ function such as bladder or bowel dysfunction.  She denied drainage from the incisions.   Current Meds:  Outpatient Encounter Medications as of 05/31/2020  Medication Sig  . ALPRAZolam (XANAX) 0.5 MG  tablet Take 0.5 mg by mouth 3 (three) times daily as needed for anxiety.  Marland Kitchen escitalopram (LEXAPRO) 10 MG tablet Take 10 mg by mouth daily.  Marland Kitchen ezetimibe (ZETIA) 10 MG tablet Take 10 mg by mouth daily.  . FEROSUL 325 (65 Fe) MG tablet Take 325 mg by mouth daily.  Marland Kitchen ibuprofen (ADVIL) 200 MG tablet Take 200 mg by mouth every 6 (six) hours as needed.  Marland Kitchen levothyroxine (SYNTHROID) 125 MCG tablet Take 125 mcg by mouth daily.  Marland Kitchen OZEMPIC, 1 MG/DOSE, 4 MG/3ML SOPN Inject 1 mg into the muscle every Friday. Used for weight loss  . REPATHA SURECLICK 989 MG/ML SOAJ Inject 140 mg into the muscle every 14 (fourteen) days.   No facility-administered encounter medications on file as of 05/31/2020.    Allergy:  Allergies  Allergen Reactions  . Ptu [Propylthiouracil] Other (See Comments)    Heart attack, fever  . Codeine Nausea And Vomiting  . Soy Allergy Other (See Comments)    "puffy eyes and I don't feel well"    Social Hx:   Social History   Socioeconomic History  . Marital status: Married    Spouse name: Not on file  . Number of children: Not on file  . Years of education: Not on file  . Highest education level: Not on file  Occupational History  . Not on file  Tobacco Use  . Smoking status: Never Smoker  . Smokeless tobacco: Never Used  Vaping Use  . Vaping Use: Never used  Substance and Sexual Activity  . Alcohol use: No  . Drug use: No  . Sexual activity: Yes    Birth control/protection: None  Other Topics Concern  . Not on file  Social History Narrative  . Not on file   Social Determinants of Health   Financial Resource Strain: Not on file  Food Insecurity: Not on file  Transportation Needs: Not on file  Physical Activity: Not on file  Stress: Not on file  Social Connections: Not on file  Intimate Partner Violence: Not on file    Past Surgical Hx:  Past Surgical History:  Procedure Laterality Date  . BREAST ENHANCEMENT SURGERY    . HERNIA REPAIR  08/1972   2 months  old  . ROBOTIC ASSISTED TOTAL HYSTERECTOMY WITH BILATERAL SALPINGO OOPHERECTOMY Right 05/09/2020   Procedure: XI ROBOTIC ASSISTED   TOTAL HYSTERECTOMY WITH BILATERAL SALPINGO OOPHORECTOMY;  Surgeon: Everitt Amber, MD;  Location: [facility];  Service: Gynecology;  Laterality: Right;  . THYROIDECTOMY N/A 03/03/2016   Procedure: TOTAL THYROIDECTOMY;  Surgeon: Armandina Gemma, MD;  Location: [facility];  Service: General;  Laterality: N/A;  . WISDOM TOOTH EXTRACTION    . XI ROBOTIC LAPAROSCOPIC ASSISTED APPENDECTOMY N/A 05/09/2020   Procedure: ROBOTIC  LAPAROSCOPIC APPENDECTOMY AND PARTIAL OMENTECTOMY;  Surgeon: Ileana Roup, MD;  Location: [facility];  Service: General;  Laterality: N/A;    Past Medical Hx:  Past Medical History:  Diagnosis Date  . Adopted    "patient is adopted"  . Anxiety   . Dysrhythmia    hx of tachycardia no problems at present   .  Hyperlipidemia   . Hypertension    hx of due to Graves disease nothing current on preop of 05/04/2020   . Thyroid disease    Graves disease    Past Gynecological History:  See HPI No LMP recorded. Patient is perimenopausal.  Family Hx:  Family History  Adopted: Yes  Problem Relation Age of Onset  . Leukemia Maternal Grandmother   . Breast cancer Neg Hx   . Colon cancer Neg Hx   . Prostate cancer Neg Hx   . Pancreatic cancer Neg Hx   . Ovarian cancer Neg Hx     Review of Systems:  Constitutional  Feels well,    ENT Normal appearing ears and nares bilaterally Skin/Breast  No rash, sores, jaundice, itching, dryness Cardiovascular  No chest pain, shortness of breath, or edema  Pulmonary  No cough or wheeze.  Gastro Intestinal  No nausea, vomitting, or diarrhoea. No bright red blood per rectum, no abdominal pain, change in bowel movement, or constipation.  Genito Urinary  No frequency, urgency, dysuria,  Musculo Skeletal  No myalgia, arthralgia, joint swelling or pain  Neurologic  No weakness, numbness, change in gait,   Psychology  No depression, anxiety, insomnia.   Vitals:  Blood pressure (!) 132/92, pulse 88, temperature (!) 97.4 F (36.3 C), temperature source Tympanic, resp. rate 20, height 5\' 4"  (1.626 m), weight 157 lb (71.2 kg), SpO2 100 %.  Physical Exam: WD in NAD Neck  Supple NROM, without any enlargements.  Lymph Node Survey No cervical supraclavicular or inguinal adenopathy Cardiovascular  Well perfused peripheries Lungs  No increased WOB Skin  No rash/lesions/breakdown  Psychiatry  Alert and oriented to person, place, and time  Abdomen  Normoactive bowel sounds, abdomen soft, non-tender and nonobese without evidence of hernia.  The incisions are all very well-healed at the skin level.  Underlying the left lateral port site there is a small knot at the level of the fascia which is consistent with suture reaction.  No palpable hematoma.  No palpable hernia with elevation of head from supine. Back No CVA tenderness Genito Urinary  Incision at cuff healing normally, no blood, sutures in tact. No lesions.  Rectal  deferred Extremities  No bilateral cyanosis, clubbing or edema.   Thereasa Solo, MD  05/31/2020, 2:01 PM

## 2020-05-31 ENCOUNTER — Inpatient Hospital Stay: Payer: 59 | Attending: Gynecologic Oncology | Admitting: Gynecologic Oncology

## 2020-05-31 ENCOUNTER — Other Ambulatory Visit: Payer: Self-pay

## 2020-05-31 ENCOUNTER — Encounter: Payer: Self-pay | Admitting: Gynecologic Oncology

## 2020-05-31 VITALS — BP 132/92 | HR 88 | Temp 97.4°F | Resp 20 | Ht 64.0 in | Wt 157.0 lb

## 2020-05-31 DIAGNOSIS — N809 Endometriosis, unspecified: Secondary | ICD-10-CM

## 2020-05-31 DIAGNOSIS — Z90710 Acquired absence of both cervix and uterus: Secondary | ICD-10-CM | POA: Insufficient documentation

## 2020-05-31 DIAGNOSIS — Z9049 Acquired absence of other specified parts of digestive tract: Secondary | ICD-10-CM | POA: Diagnosis not present

## 2020-05-31 DIAGNOSIS — Z90721 Acquired absence of ovaries, unilateral: Secondary | ICD-10-CM | POA: Diagnosis not present

## 2020-05-31 DIAGNOSIS — N808 Other endometriosis: Secondary | ICD-10-CM | POA: Insufficient documentation

## 2020-05-31 DIAGNOSIS — Z9079 Acquired absence of other genital organ(s): Secondary | ICD-10-CM

## 2020-05-31 DIAGNOSIS — R1900 Intra-abdominal and pelvic swelling, mass and lump, unspecified site: Secondary | ICD-10-CM

## 2020-05-31 NOTE — Patient Instructions (Signed)
Dr Denman George believes that the pain you are feeling in the left abdomen is from the deep stitch in your muscle which should get better over the next 2 months as it slowly dissolves.  Please avoid intercourse (sex) for another 5 weeks as the internal stitch line remains weak.  Please avoid heavy lifting for another 1 week.  You can bathe/swim at this point.    Please contact Dr Denman George with questions at [phone].

## 2020-09-06 ENCOUNTER — Other Ambulatory Visit: Payer: Self-pay | Admitting: Internal Medicine

## 2020-09-06 DIAGNOSIS — Z1231 Encounter for screening mammogram for malignant neoplasm of breast: Secondary | ICD-10-CM

## 2020-09-21 ENCOUNTER — Encounter: Payer: Self-pay | Admitting: Plastic Surgery

## 2020-09-21 ENCOUNTER — Other Ambulatory Visit: Payer: Self-pay

## 2020-09-21 ENCOUNTER — Ambulatory Visit (INDEPENDENT_AMBULATORY_CARE_PROVIDER_SITE_OTHER): Payer: Self-pay | Admitting: Plastic Surgery

## 2020-09-21 VITALS — BP 126/85 | HR 90 | Ht 64.0 in | Wt 152.2 lb

## 2020-09-21 DIAGNOSIS — Z411 Encounter for cosmetic surgery: Secondary | ICD-10-CM

## 2020-09-21 NOTE — Progress Notes (Signed)
Referring Provider Nicoletta Dress, MD [address],  [place] [id number]   CC:  Chief Complaint  Patient presents with  . consult      Joanne Hunt is an 50 y.o. female.  HPI: Patient presents to discuss a number of cosmetic concerns.  She is bothered by the lower eye bags that are gradually getting worse with time and make her look tired.  She is bothered by slightly increased volume in the submental area and in the jowls.  On her face she also would like a more even pigment is bothered by 7 red pigmentation and some brown age spots that are developing with time.  Regarding her breast she has had a breast augmentation 18 years prior with submuscular saline implants.  This was done here locally.  She feels that she may be slightly bigger now than she wants to be and is definitely bothered by the size of her areola.  She also feels like the skin around the implant has gotten a little lax and wants to see what could be done for that.  She also wants to discuss her abdomen.  She has had a number of laparoscopic abdominal procedures including hysterectomy and unilateral oophorectomy.  She has had a laparoscopic appendectomy.  She is bothered by the excess skin and the overall contour.  Allergies  Allergen Reactions  . Ptu [Propylthiouracil] Other (See Comments)    Heart attack, fever  . Codeine Nausea And Vomiting  . Soy Allergy Other (See Comments)    "puffy eyes and I don't feel well"    Outpatient Encounter Medications as of 09/21/2020  Medication Sig Note  . ALPRAZolam (XANAX) 0.5 MG tablet Take 0.5 mg by mouth 3 (three) times daily as needed for anxiety. 05/01/2020: Takes 1 time daily   . escitalopram (LEXAPRO) 10 MG tablet Take 10 mg by mouth daily.   Marland Kitchen ezetimibe (ZETIA) 10 MG tablet Take 10 mg by mouth daily.   . FEROSUL 325 (65 Fe) MG tablet Take 325 mg by mouth daily.   Marland Kitchen ibuprofen (ADVIL) 200 MG tablet Take 200 mg by mouth every 6 (six) hours as  needed.   Marland Kitchen levothyroxine (SYNTHROID) 125 MCG tablet Take 125 mcg by mouth daily.   Marland Kitchen OZEMPIC, 1 MG/DOSE, 4 MG/3ML SOPN Inject 1 mg into the muscle every Friday. Used for weight loss   . REPATHA SURECLICK 191 MG/ML SOAJ Inject 140 mg into the muscle every 14 (fourteen) days.    No facility-administered encounter medications on file as of 09/21/2020.     Past Medical History:  Diagnosis Date  . Adopted    "patient is adopted"  . Anxiety   . Dysrhythmia    hx of tachycardia no problems at present   . Hyperlipidemia   . Hypertension    hx of due to Graves disease nothing current on preop of 05/04/2020   . Thyroid disease    Graves disease    Past Surgical History:  Procedure Laterality Date  . BREAST ENHANCEMENT SURGERY    . HERNIA REPAIR  08/1972   2 months old  . ROBOTIC ASSISTED TOTAL HYSTERECTOMY WITH BILATERAL SALPINGO OOPHERECTOMY Right 05/09/2020   Procedure: XI ROBOTIC ASSISTED   TOTAL HYSTERECTOMY WITH BILATERAL SALPINGO OOPHORECTOMY;  Surgeon: Everitt Amber, MD;  Location: [facility];  Service: Gynecology;  Laterality: Right;  . THYROIDECTOMY N/A 03/03/2016   Procedure: TOTAL THYROIDECTOMY;  Surgeon: Armandina Gemma, MD;  Location: [facility];  Service:  General;  Laterality: N/A;  . WISDOM TOOTH EXTRACTION    . XI ROBOTIC LAPAROSCOPIC ASSISTED APPENDECTOMY N/A 05/09/2020   Procedure: ROBOTIC  LAPAROSCOPIC APPENDECTOMY AND PARTIAL OMENTECTOMY;  Surgeon: Ileana Roup, MD;  Location: [facility];  Service: General;  Laterality: N/A;    Family History  Adopted: Yes  Problem Relation Age of Onset  . Leukemia Maternal Grandmother   . Breast cancer Neg Hx   . Colon cancer Neg Hx   . Prostate cancer Neg Hx   . Pancreatic cancer Neg Hx   . Ovarian cancer Neg Hx     Social History   Social History Narrative  . Not on file  Denies tobacco use  Review of Systems General: Denies fevers, chills, weight loss CV: Denies chest pain, shortness of breath, palpitations  Physical  Exam Vitals with BMI 09/21/2020 05/31/2020 05/09/2020  Height 5\' 4"  5\' 4"  -  Weight 152 lbs 3 oz 157 lbs -  BMI 51.02 58.52 -  Systolic 778 242 353  Diastolic 85 92 88  Pulse 90 88 79    General:  No acute distress,  Alert and oriented, Non-Toxic, Normal speech and affect Face: Patient has mild erythema and diffuse faint brown macules which are bothersome to her.  She has a prominent lid cheek junction and some excess lower eyelid skin.  She does not have much in the way of excess upper eyelid skin and her brows are in good position.  She has mild Jowell deformity and mild submental soft tissue prominence.  There is no obvious scars. Breast: She had a periareolar approach for saline submuscular augmentation.  The symmetry looks reasonable at this point.  Implants are in reasonably good position with her standing up.  There has been some descent of the breast tissue off the implant and the areolas have widened quite a bit. Abdomen: Abdomen is soft nontender.  I do not appreciate any hernias.  She has some surrounding small laparoscopic scars and a small scar in her suprapubic area which she says is from a hernia as a child.  She has moderate excess skin mostly focused in the infraumbilical area and moderate excess adipose tissue centered in the infraumbilical area in the flanks.  She appears to have mild rectus diastases.  Assessment/Plan Patient presents with a number of the static concerns.  I think the pigment changes in her face would be most readily addressed through laser which could be done here in the office.  For her lower lids I recommended transchondral lower lid blepharoplasty to detach the orbital malar ligament and more than likely do a small skin pinch to remove the excess skin anteriorly.  We discussed the risks and benefits of that procedure that include bleeding, infection, damage to surrounding structures and need for additional procedures.  We discussed lower lid malposition and the  things that I would do to prevent that from happening.  Regarding the breast we discussed a number of different possibilities.  If she wanted to be smaller we could put in smaller implants and then do a lift over top of that.  She seems content to leave the implants and at this point and might just prefer to do periareolar mastopexy to make the areola smaller and to tighten the skin envelope which I think would be a reasonable result for her.  We discussed the risks and benefits of this procedure and I think she is a good candidate for it  Regarding the abdomen we discussed abdominoplasty  with liposuction.  We discussed the risks and benefits of that that include bleeding, infection, damage to surrounding structures need for additional procedures.  We discussed the potential for persistent contour irregularities and the expected postoperative recovery process.  We discussed the need for drains and the need to avoid strenuous activity for 8 weeks if a rectus plication is done.  I do think this would be a good operation for her and would address her goals and concerns.  We will plan to provide a quote for her and go from there.  Cindra Presume 09/21/2020, 12:35 PM

## 2020-09-27 ENCOUNTER — Institutional Professional Consult (permissible substitution): Payer: 59 | Admitting: Plastic Surgery

## 2020-10-02 ENCOUNTER — Telehealth: Payer: Self-pay | Admitting: *Deleted

## 2020-10-02 NOTE — Telephone Encounter (Signed)
Patient called and stated "I had surgery in December with Dr Denman George and Dr Dema Severin. [employer] has denied the surgery. I have talked with them last night and they stated that the information sent did not state it was medical necessary. They stated that Dr Denman George needs to send a letter/information stating it was medical necessary to have the surgery." Explained that it looked like it was authorized, but the message would be sent to the correct ppl. Patient requested a follow up call.

## 2020-10-03 ENCOUNTER — Ambulatory Visit (INDEPENDENT_AMBULATORY_CARE_PROVIDER_SITE_OTHER): Payer: Self-pay

## 2020-10-03 ENCOUNTER — Other Ambulatory Visit: Payer: Self-pay

## 2020-10-03 VITALS — BP 148/88 | HR 62 | Ht 64.0 in | Wt 152.0 lb

## 2020-10-03 DIAGNOSIS — Z411 Encounter for cosmetic surgery: Secondary | ICD-10-CM

## 2020-10-03 DIAGNOSIS — Z719 Counseling, unspecified: Secondary | ICD-10-CM

## 2020-10-03 NOTE — Telephone Encounter (Signed)
Called the patient and explained that the letter is ready for pick up

## 2020-10-04 ENCOUNTER — Telehealth: Payer: Self-pay | Admitting: Plastic Surgery

## 2020-10-04 ENCOUNTER — Institutional Professional Consult (permissible substitution): Payer: 59 | Admitting: Plastic Surgery

## 2020-10-04 NOTE — Telephone Encounter (Signed)
Patient requesting call back. She has questions about the surgery and about the garment she will need for after surgery.

## 2020-10-04 NOTE — Telephone Encounter (Addendum)
Returned patients call. She can not take anything with codeine, but has taken tramadol for pain in the past and does well.  Advised patient she will wake up after surgery with a binder around her abdomen and ace wrap around her thighs along with 2 drains on both sides of her hips. She is not to remove these garments until 3 days after surgery. At the first PO visit, Dr.Pace will observe when she is ready to wear  spanks that will need to cover her abdomen down to the knees for compression. Patient understood and agreed with plan.

## 2020-10-07 ENCOUNTER — Encounter (HOSPITAL_COMMUNITY): Payer: Self-pay | Admitting: Emergency Medicine

## 2020-10-07 ENCOUNTER — Emergency Department (HOSPITAL_COMMUNITY)
Admission: EM | Admit: 2020-10-07 | Discharge: 2020-10-07 | Disposition: A | Discharge status: Home / Self Care | Payer: 59 | Attending: Emergency Medicine | Admitting: Emergency Medicine

## 2020-10-07 ENCOUNTER — Other Ambulatory Visit: Payer: Self-pay

## 2020-10-07 ENCOUNTER — Emergency Department (HOSPITAL_COMMUNITY): Payer: 59

## 2020-10-07 DIAGNOSIS — R079 Chest pain, unspecified: Secondary | ICD-10-CM | POA: Diagnosis present

## 2020-10-07 DIAGNOSIS — M79602 Pain in left arm: Secondary | ICD-10-CM | POA: Diagnosis not present

## 2020-10-07 DIAGNOSIS — R61 Generalized hyperhidrosis: Secondary | ICD-10-CM | POA: Insufficient documentation

## 2020-10-07 DIAGNOSIS — I10 Essential (primary) hypertension: Secondary | ICD-10-CM | POA: Diagnosis not present

## 2020-10-07 DIAGNOSIS — R072 Precordial pain: Secondary | ICD-10-CM | POA: Diagnosis not present

## 2020-10-07 DIAGNOSIS — R5381 Other malaise: Secondary | ICD-10-CM | POA: Insufficient documentation

## 2020-10-07 DIAGNOSIS — R197 Diarrhea, unspecified: Secondary | ICD-10-CM | POA: Insufficient documentation

## 2020-10-07 LAB — CBC WITH DIFFERENTIAL/PLATELET
Abs Immature Granulocytes: 0.01 10*3/uL (ref 0.00–0.07)
Basophils Absolute: 0.1 10*3/uL (ref 0.0–0.1)
Basophils Relative: 1 %
Eosinophils Absolute: 0.2 10*3/uL (ref 0.0–0.5)
Eosinophils Relative: 4 %
HCT: 44.9 % (ref 36.0–46.0)
Hemoglobin: 15.4 g/dL — ABNORMAL HIGH (ref 12.0–15.0)
Immature Granulocytes: 0 %
Lymphocytes Relative: 37 %
Lymphs Abs: 1.8 10*3/uL (ref 0.7–4.0)
MCH: 30.7 pg (ref 26.0–34.0)
MCHC: 34.3 g/dL (ref 30.0–36.0)
MCV: 89.6 fL (ref 80.0–100.0)
Monocytes Absolute: 0.4 10*3/uL (ref 0.1–1.0)
Monocytes Relative: 9 %
Neutro Abs: 2.3 10*3/uL (ref 1.7–7.7)
Neutrophils Relative %: 49 %
Platelets: 313 10*3/uL (ref 150–400)
RBC: 5.01 MIL/uL (ref 3.87–5.11)
RDW: 12.6 % (ref 11.5–15.5)
WBC: 4.7 10*3/uL (ref 4.0–10.5)
nRBC: 0 % (ref 0.0–0.2)

## 2020-10-07 LAB — COMPREHENSIVE METABOLIC PANEL
ALT: 21 U/L (ref 0–44)
AST: 24 U/L (ref 15–41)
Albumin: 3.9 g/dL (ref 3.5–5.0)
Alkaline Phosphatase: 65 U/L (ref 38–126)
Anion gap: 9 (ref 5–15)
BUN: 13 mg/dL (ref 6–20)
CO2: 24 mmol/L (ref 22–32)
Calcium: 8.6 mg/dL — ABNORMAL LOW (ref 8.9–10.3)
Chloride: 105 mmol/L (ref 98–111)
Creatinine, Ser: 0.96 mg/dL (ref 0.44–1.00)
GFR, Estimated: >60 mL/min (ref >60)
Glucose, Bld: 106 mg/dL — ABNORMAL HIGH (ref 70–99)
Potassium: 3.6 mmol/L (ref 3.5–5.1)
Sodium: 138 mmol/L (ref 135–145)
Total Bilirubin: 0.7 mg/dL (ref 0.3–1.2)
Total Protein: 6.8 g/dL (ref 6.5–8.1)

## 2020-10-07 LAB — I-STAT BETA HCG BLOOD, ED (MC, WL, AP ONLY): I-stat hCG, quantitative: <5 m[IU]/mL (ref <5)

## 2020-10-07 LAB — TROPONIN I (HIGH SENSITIVITY)
Troponin I (High Sensitivity): 2 ng/L (ref <18)
Troponin I (High Sensitivity): <2 ng/L (ref <18)

## 2020-10-07 MED ORDER — ASPIRIN 81 MG PO CHEW
162.0000 mg | CHEWABLE_TABLET | Freq: Once | ORAL | Status: AC
  Administered 2020-10-07: 162 mg via ORAL
  Filled 2020-10-07: qty 2

## 2020-10-07 MED ORDER — NITROGLYCERIN 0.4 MG SL SUBL
0.4000 mg | SUBLINGUAL_TABLET | SUBLINGUAL | Status: DC | PRN
  Administered 2020-10-07: 0.4 mg via SUBLINGUAL
  Filled 2020-10-07: qty 1

## 2020-10-07 MED ORDER — MORPHINE SULFATE (PF) 4 MG/ML IV SOLN: 4.0000 mg | Freq: Once | INTRAVENOUS | Status: DC

## 2020-10-07 NOTE — ED Notes (Signed)
Pt discharged and ambulated out of the ED without difficulty. 

## 2020-10-07 NOTE — ED Provider Notes (Signed)
[facility] EMERGENCY DEPARTMENT Provider Note   CSN: [id number] Arrival date & time: 10/07/20  1607     History Chief Complaint  Patient presents with  . Chest Pain    Joanne Hunt is a 50 y.o. female.  The history is provided by the patient and medical records.  Chest Pain  Joanne Hunt is a 50 y.o. female who presents to the Emergency Department complaining of chest pain. She presents the emergency department complaining of chest pain. Yesterday she developed a dull pain to her left shoulder and left upper arm. She has associated malaise. Earlier today she developed associated chest tightness and diaphoresis. She did have an episode of diarrhea as well. Overall the chest tightness has resolved. She is not short of breath but feels that she is breathing differently. No abdominal pain, leg swelling or pain. She has a history of hyperlipidemia. She is a non-smoker. No personal or family history of DVT/PE. She does have a family history of coronary artery disease and her father in his 49s. She is adopted and does not know an extensive family history.    Past Medical History:  Diagnosis Date  . Adopted    "patient is adopted"  . Anxiety   . Dysrhythmia    hx of tachycardia no problems at present   . Hyperlipidemia   . Hypertension    hx of due to Graves disease nothing current on preop of 05/04/2020   . Thyroid disease    Graves disease    Patient Active Problem List   Diagnosis Date Noted  . Adnexal mass 05/09/2020  . 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 Abnormal uterine bleeding 05/09/2020  . Graves' disease 02/12/2016    Past Surgical History:  Procedure Laterality Date  . BREAST ENHANCEMENT SURGERY    . HERNIA REPAIR  08/1972   2 months old  . ROBOTIC ASSISTED TOTAL HYSTERECTOMY WITH BILATERAL SALPINGO OOPHERECTOMY Right 05/09/2020   Procedure: XI ROBOTIC ASSISTED   TOTAL HYSTERECTOMY WITH BILATERAL SALPINGO OOPHORECTOMY;  Surgeon: Everitt Amber, MD;  Location: [facility];  Service: Gynecology;  Laterality: Right;  . THYROIDECTOMY N/A 03/03/2016   Procedure: TOTAL THYROIDECTOMY;  Surgeon: Armandina Gemma, MD;  Location: [facility];  Service: General;  Laterality: N/A;  . WISDOM TOOTH EXTRACTION    . XI ROBOTIC LAPAROSCOPIC ASSISTED APPENDECTOMY N/A 05/09/2020   Procedure: ROBOTIC  LAPAROSCOPIC APPENDECTOMY AND PARTIAL OMENTECTOMY;  Surgeon: Ileana Roup, MD;  Location: [facility];  Service: General;  Laterality: N/A;     OB History   No obstetric history on file.     Family History  Adopted: Yes  Problem Relation Age of Onset  . Leukemia Maternal Grandmother   . Breast cancer Neg Hx   . Colon cancer Neg Hx   . Prostate cancer Neg Hx   . Pancreatic cancer Neg Hx   . Ovarian cancer Neg Hx     Social History   Tobacco Use  . Smoking status: Never Smoker  . Smokeless tobacco: Never Used  Vaping Use  . Vaping Use: Never used  Substance Use Topics  . Alcohol use: No  . Drug use: No    Home Medications Prior to Admission medications   Medication Sig Start Date End Date Taking? Authorizing Provider  ALPRAZolam Duanne Moron) 0.5 MG tablet Take 0.5 mg by mouth 3 (three) times daily as needed for anxiety. 08/25/19  Yes [provider]  escitalopram (LEXAPRO) 10 MG tablet Take 5 mg by  mouth at bedtime.   Yes [provider]  ezetimibe (ZETIA) 10 MG tablet Take 10 mg by mouth daily.   Yes [provider]  FEROSUL 325 (65 Fe) MG tablet Take 325 mg by mouth daily. 08/14/19  Yes [provider]  ibuprofen (ADVIL) 200 MG tablet Take 200 mg by mouth every 6 (six) hours as needed for moderate pain.   Yes [provider]  levothyroxine (SYNTHROID) 100 MCG tablet Take 100 mcg by mouth daily. 10/04/20  Yes [provider]  OZEMPIC, 2 MG/DOSE, 8 MG/3ML SOPN Inject 1 mg into the skin once a week. 10/02/20  Yes [provider]  REPATHA SURECLICK 366 MG/ML SOAJ Inject 140 mg into the  muscle every 14 (fourteen) days. 09/19/19  Yes [provider]    Allergies    Ptu [propylthiouracil], Codeine, and Soy allergy  Review of Systems   Review of Systems  Cardiovascular: Positive for chest pain.  All other systems reviewed and are negative.   Physical Exam Updated Vital Signs BP (!) 143/94   Pulse 91   Temp 98.3 F (36.8 C) (Oral)   Resp 17   Ht 5\' 4"  (1.626 m)   Wt 68 kg   SpO2 98%   BMI 25.75 kg/m   Physical Exam Vitals and nursing note reviewed.  Constitutional:      Appearance: She is well-developed.  HENT:     Head: Normocephalic and atraumatic.  Cardiovascular:     Rate and Rhythm: Normal rate and regular rhythm.     Heart sounds: No murmur heard.   Pulmonary:     Effort: Pulmonary effort is normal. No respiratory distress.     Breath sounds: Normal breath sounds.  Abdominal:     Palpations: Abdomen is soft.     Tenderness: There is no abdominal tenderness. There is no guarding or rebound.  Musculoskeletal:        General: No swelling or tenderness.     Comments: 2+ DP and radial pulses bilaterally.LUE rom wnl.   Skin:    General: Skin is warm and dry.  Neurological:     Mental Status: She is alert and oriented to person, place, and time.  Psychiatric:        Behavior: Behavior normal.     ED Results / Procedures / Treatments   Labs (all labs ordered are listed, but only abnormal results are displayed) Labs Reviewed  CBC WITH DIFFERENTIAL/PLATELET - Abnormal; Notable for the following components:      Result Value   Hemoglobin 15.4 (*)    All other components within normal limits  COMPREHENSIVE METABOLIC PANEL - Abnormal; Notable for the following components:   Glucose, Bld 106 (*)    Calcium 8.6 (*)    All other components within normal limits  I-STAT BETA HCG BLOOD, ED ([facility], [facility], [facility] ONLY)  TROPONIN I (HIGH SENSITIVITY)  TROPONIN I (HIGH SENSITIVITY)    EKG EKG Interpretation  Date/Time:  Sunday Oct 07 2020 16:16:38  EDT Ventricular Rate:  90 PR Interval:  138 QRS Duration: 82 QT Interval:  346 QTC Calculation: 423 R Axis:   66 Text Interpretation: Normal sinus rhythm Nonspecific T wave abnormality Abnormal ECG Confirmed by Quintella Reichert [phone] on 10/07/2020 4:53:11 PM   Radiology DG Chest 1 View  Result Date: 10/07/2020 CLINICAL DATA:  Chest pain. EXAM: CHEST  1 VIEW COMPARISON:  07/22/2017 FINDINGS: The cardiomediastinal contours are normal. The lungs are clear. Pulmonary vasculature is normal. No consolidation, pleural  effusion, or pneumothorax. No acute osseous abnormalities are seen. IMPRESSION: No acute chest findings. Electronically Signed   By: Keith Rake M.D.   On: 10/07/2020 16:55    Procedures Procedures   Medications Ordered in ED Medications  nitroGLYCERIN (NITROSTAT) SL tablet 0.4 mg (0.4 mg Sublingual Given 10/07/20 1737)  morphine 4 MG/ML injection 4 mg (4 mg Intravenous Patient Refused/Not Given 10/07/20 1904)  aspirin chewable tablet 162 mg (162 mg Oral Given 10/07/20 1701)    ED Course  I have reviewed the triage vital signs and the nursing notes.  Pertinent labs & imaging results that were available during my care of the patient were reviewed by me and considered in my medical decision making (see chart for details).    MDM Rules/Calculators/A&P                         patient here for evaluation of left arm pain since yesterday, chest pain today. She is neurovascularly intact on evaluation. She is low risk for PE, dissection. Presentation is not consistent with ACS, troponins are negative times two. Her hear score is four. Discussed with patient option for admission for cardiac observation versus discharge home with close outpatient follow-up. She prefers to discharge home. Discussed return precautions.  Final Clinical Impression(s) / ED Diagnoses Final diagnoses:  Precordial pain  Left arm pain    Rx / DC Orders ED Discharge Orders    None       Quintella Reichert, MD 10/07/20 2319

## 2020-10-07 NOTE — ED Triage Notes (Signed)
Patient coming in with radiating chest tightness that occurred today.

## 2020-10-07 NOTE — ED Provider Notes (Signed)
Emergency Medicine Provider Triage Evaluation Note  Joanne Hunt , a 50 y.o. female  was evaluated in triage.  Pt complains of left arm pain that started last night. Pain has been persistent; however she began to develop chest tightness prior to arrival. No relationship to exertion. Had one episode of diaphoresis, but none currently. States her breathing is slightly different, but denies shortness of breath. No nausea or vomiting. No previous MI or CVA. No tobacco use. No injury to arm. She took 2 ASA 81mg  prior to arrival.   Review of Systems  Positive:         Chest pain Negative: fever  Physical Exam  BP (!) 155/117 (BP Location: Left Arm)   Pulse 97   Temp 98.1 F (36.7 C)   Resp 20   SpO2 100%  Gen:   Awake, no distress   Resp:  Normal effort  MSK:   Moves extremities without difficulty  Other:    Medical Decision Making  Medically screening exam initiated at 4:26 PM.  Appropriate orders placed.  Joanne Hunt was informed that the remainder of the evaluation will be completed by another provider, this initial triage assessment does not replace that evaluation, and the importance of remaining in the ED until their evaluation is complete.  Cardiac labs ordered to rule out cardiac etiology.    Joanne Hunt 10/07/20 1629    Joanne Pence, MD 10/07/20 [phone]

## 2020-10-07 NOTE — ED Notes (Signed)
Patient stated that she was in pain.  MD made aware.  Talked with the patient again about orders the doctor placed.  Patient states that she would rather wait for lab values to come back and just take aleve.

## 2020-10-08 NOTE — Progress Notes (Signed)
Patient ID: Joanne Hunt, female    DOB: 06/05/1970, 50 y.o.     Dx:  Encounter for Cosmetic Procedure  History of Present Illness: Joanne Hunt is a 50 y.o.  female   .  She presents for preoperative evaluation for upcoming procedure: Liposuction of thighs/neck & jowls Lower blepharoplasty Abdominoplasty with Liposuction scheduled for 10/19/20 with Dr. Claudia Desanctis.  The patient has not had problems with anesthesia.  Summary of Previous Visit: patient consulted with Dr. Claudia Desanctis for Cosmetic procedure     PMH Significant for:  Graves Disease- Thyroidectomy 2017 HTN- d/t Graves DX- no present symptoms Lap/robotic assisted hysterectomy/unilateral oophorectomy & appendectomy 05/09/2020- d/t abnormal uterine bleeding    Past Medical History: Allergies: Allergies  Allergen Reactions  . Ptu [Propylthiouracil] Other (See Comments)    Heart attack, fever  . Codeine Nausea And Vomiting  . Soy Allergy Other (See Comments)    "puffy eyes and I don't feel well"    Current Medications:  Current Outpatient Medications:  .  ALPRAZolam (XANAX) 0.5 MG tablet, Take 0.5 mg by mouth 3 (three) times daily as needed for anxiety., Disp: , Rfl:  .  escitalopram (LEXAPRO) 10 MG tablet, Take 5 mg by mouth at bedtime., Disp: , Rfl:  .  ezetimibe (ZETIA) 10 MG tablet, Take 10 mg by mouth daily., Disp: , Rfl:  .  FEROSUL 325 (65 Fe) MG tablet, Take 325 mg by mouth daily., Disp: , Rfl:  .  REPATHA SURECLICK 062 MG/ML SOAJ, Inject 140 mg into the muscle every 14 (fourteen) days., Disp: , Rfl:  .  ibuprofen (ADVIL) 200 MG tablet, Take 200 mg by mouth every 6 (six) hours as needed for moderate pain., Disp: , Rfl:  .  levothyroxine (SYNTHROID) 100 MCG tablet, Take 100 mcg by mouth daily., Disp: , Rfl:  .  OZEMPIC, 2 MG/DOSE, 8 MG/3ML SOPN, Inject 1 mg into the skin once a week., Disp: , Rfl:   Past Medical Problems: Past Medical History:  Diagnosis Date  . Adopted    "patient is adopted"  .  Anxiety   . Dysrhythmia    hx of tachycardia no problems at present   . Hyperlipidemia   . Hypertension    hx of due to Graves disease nothing current on preop of 05/04/2020   . Thyroid disease    Graves disease    Past Surgical History: Past Surgical History:  Procedure Laterality Date  . BREAST ENHANCEMENT SURGERY    . HERNIA REPAIR  08/1972   2 months old  . ROBOTIC ASSISTED TOTAL HYSTERECTOMY WITH BILATERAL SALPINGO OOPHERECTOMY Right 05/09/2020   Procedure: XI ROBOTIC ASSISTED   TOTAL HYSTERECTOMY WITH BILATERAL SALPINGO OOPHORECTOMY;  Surgeon: Everitt Amber, MD;  Location: [facility];  Service: Gynecology;  Laterality: Right;  . THYROIDECTOMY N/A 03/03/2016   Procedure: TOTAL THYROIDECTOMY;  Surgeon: Armandina Gemma, MD;  Location: [facility];  Service: General;  Laterality: N/A;  . WISDOM TOOTH EXTRACTION    . XI ROBOTIC LAPAROSCOPIC ASSISTED APPENDECTOMY N/A 05/09/2020   Procedure: ROBOTIC  LAPAROSCOPIC APPENDECTOMY AND PARTIAL OMENTECTOMY;  Surgeon: Ileana Roup, MD;  Location: [facility];  Service: General;  Laterality: N/A;    Social History: Social History   Socioeconomic History  . Marital status: Married    Spouse name: Not on file  . Number of children: Not on file  . Years of education: Not on file  . Highest education level: Not on file  Occupational History  .  Not on file  Tobacco Use  . Smoking status: Never Smoker  . Smokeless tobacco: Never Used  Vaping Use  . Vaping Use: Never used  Substance and Sexual Activity  . Alcohol use: No  . Drug use: No  . Sexual activity: Yes    Birth control/protection: None  Other Topics Concern  . Not on file  Social History Narrative  . Not on file   Social Determinants of Health   Financial Resource Strain: Not on file  Food Insecurity: Not on file  Transportation Needs: Not on file  Physical Activity: Not on file  Stress: Not on file  Social Connections: Not on file  Intimate Partner Violence: Not on file     Family History: Family History  Adopted: Yes  Problem Relation Age of Onset  . Leukemia Maternal Grandmother   . Breast cancer Neg Hx   . Colon cancer Neg Hx   . Prostate cancer Neg Hx   . Pancreatic cancer Neg Hx   . Ovarian cancer Neg Hx     Review of Systems: ROS  EKG- ordered by her PCP in 2017 d/t arrhythmia - this was negative & it was determined that she was experiencing arrhythmia & HTN d/t Graves Disease She has had no other cardiac symptoms to date No Hx of the following: Asthma/sleep apnea/ No Hx of DVT/varicose veins/ no PE No swelling in feet/legs/ankles Weight loss= 30#  Takes Ozempic for wt loss Had colonoscopy-2020 (negative)   Physical Exam: Vital Signs BP (!) 148/88 (BP Location: Right Arm, Patient Position: Sitting, Cuff Size: Large)   Pulse 62   Ht 5\' 4"  (1.626 m)   Wt 152 lb (68.9 kg)   SpO2 100%   BMI 26.09 kg/m   Physical Exam Constitutional:      General: Not in acute distress.    Appearance: Normal appearance. Not ill-appearing.  HENT:     Head: Normocephalic and atraumatic.  Eyes:     Pupils: Pupils are equal, round Neck:     Musculoskeletal: Normal range of motion.  Cardiovascular:     Rate and Rhythm: Normal rate    Pulses: Normal pulses.  Pulmonary:     Effort: Pulmonary effort is normal. No respiratory distress.  Abdominal:     General: Abdomen is flat. There is no distension.  Musculoskeletal: Normal range of motion.  Skin:    General: Skin is warm and dry.     Findings: No erythema or rash.  Neurological:     General: No focal deficit present.     Mental Status: Alert and oriented to person, place, and time. Mental status is at baseline.     Motor: No weakness.  Psychiatric:        Mood and Affect: Mood normal.        Behavior: Behavior normal.    Assessment/Plan: The patient is scheduled for multiple cosmetic procedures listed above with Dr. Claudia Desanctis.   Risks, benefits, and alternatives of procedure discussed,  questions answered and signed consent obtained- copy scanned into chart  Smoking Status: no; Counseling Given? n/a Last Mammogram: 2021; Results: neg  Caprini Score: 4  Risk Factors include: , BMI = 25.75 and length of planned surgery.  Recommendation for mechanical / SCD in surgery  Encourage early ambulation.   Pictures obtained: yes/entered into chart  Post-op Rx sent to pharmacy: yes- by Dr. Claudia Desanctis  Patient was provided with the  General Surgical Risk consent document and Pain Medication Agreement prior to their appointment.  They had adequate time to read through the risk consent documents and Pain Medication Agreement. We also discussed them in person together during this preop appointment. All of their questions were answered to their satisfaction.  Recommended calling if they have any further questions.  Risk consent form and Pain Medication Agreement to be scanned into patient's chart.     Electronically signed by: [place], RN 10/08/2020 4:26 PM

## 2020-10-08 NOTE — Patient Instructions (Addendum)
Patient will call for any changes or concerns 

## 2020-10-12 ENCOUNTER — Encounter: Payer: Self-pay | Admitting: Surgical

## 2020-10-12 ENCOUNTER — Telehealth: Payer: Self-pay

## 2020-10-12 NOTE — Progress Notes (Signed)
Surgical Clearance has been received from [name] for patient's upcoming surgery with Dr. Claudia Desanctis on 10/19/20 . Medications to hold prior to surgery: None Must take synthroid day of surgery

## 2020-10-12 NOTE — Telephone Encounter (Signed)
Will let requesting surgeon's office know pt hasn't been seen by our office and will need a referral for a new pt appointment.  However, not likely she can be seen by procedure date 10/19/2020, so may have to postpone procedure.  Surgeons office, please send new pt referral over requesting appt then will address clearance once pt is seen.

## 2020-10-12 NOTE — Telephone Encounter (Signed)
   [facility] Pre-operative Risk Assessment    Patient Name: Joanne Hunt  DOB: 03-17-1971  MRN: [id number]   Request for surgical clearance:  1. What type of surgery is being performed?  LIPOSUCTION OF BILATERAL THIGHS, JOWLS AND NECK WITH BILATERAL LOWER LID BLEPHAROPLASTY, ABDOMINOPLASTY W/LIPOSUCTION  2. When is this surgery scheduled? 10-19-2020   3. What type of clearance is required (medical clearance vs. Pharmacy clearance to hold med vs. Both)? MEDICAL  4. Are there any medications that need to be held prior to surgery and how long? NONE LISTED   5. Practice name and name of physician performing surgery? [facility]  DR Silverio Lay [facility]   ATTN: [place] D  6. What is the office phone number?  [phone]   7.   What is the office fax number? [phone]  8.   Anesthesia type (None, local, MAC, general) ? GENERAL

## 2020-10-12 NOTE — Telephone Encounter (Signed)
   Patient Name: Joanne Hunt  DOB: 09-16-1970  MRN: [id number]   Primary Cardiologist: None  Chart reviewed as part of pre-operative protocol coverage. Patient has never been seen by our office. Please inform the requesting office, if they want clearance, she will need to be seen and her elective procedure will need to be postponed (currently scheduled for procedure 10/19/2020). Please advise the patient to schedule an appointment to establish care and for risk stratification. Message will be routed to the requesting office and preop team, then removed from the APP pool.    Christell Faith, PA-C 10/12/2020, 10:44 AM

## 2020-10-16 NOTE — Telephone Encounter (Signed)
See notes per [facility] Dr. Claudia Desanctis, we can cancel pre op clearance by cardiology. No longer need cardiac clearance. I will forward notes to pre op provider.

## 2020-10-16 NOTE — Telephone Encounter (Signed)
I have not seen that our office has received a referral to cardiology, see previous notes from [place]. Pt has never been seen by our practice and will need a referral to cardiology to be faxed to our office. Pt will then need a NEW PT APPT for pre op clearance. I will send notes to surgeon's office to follow up on referral.

## 2020-10-16 NOTE — Telephone Encounter (Signed)
[facility] called and said that we can cancel the medical clearance. Patient was cleared by PCP

## 2020-10-18 ENCOUNTER — Other Ambulatory Visit: Payer: Self-pay | Admitting: Plastic Surgery

## 2020-10-18 ENCOUNTER — Telehealth: Payer: Self-pay | Admitting: Plastic Surgery

## 2020-10-18 MED ORDER — ONDANSETRON HCL 4 MG PO TABS: 4.0000 mg | ORAL_TABLET | Freq: Three times a day (TID) | ORAL | 0 refills | Status: DC | PRN

## 2020-10-18 MED ORDER — OXYCODONE-ACETAMINOPHEN 5-325 MG PO TABS: 1.0000 | ORAL_TABLET | ORAL | 0 refills | Status: DC | PRN

## 2020-10-18 NOTE — Telephone Encounter (Signed)
Patient called to follow up on whether prescriptions had been called in to her pharmacy. I checked Meds tab and didn't see prescription. Please call them into [facility] on [address] in [place]. Please let patient know when meds are called in.

## 2020-10-18 NOTE — Telephone Encounter (Signed)
Returned patients call. Advised Dr. Claudia Desanctis sent in Oxycodone- Acetaminophen 5-325 and Zofran to her pharmacy. She was concerned getting sick on the pain medication. She has done better in the past with Tramadol. I mentioned I can request Dr. Claudia Desanctis to change pain medication to Tramadol, or she can alternate IBU with Tylenol during the day and take the Oxy prior to bed with Zofran. If she begins to feel nausea/vomitting, call our office and we will send in Tramadol. Reminded her to take Miralax while the is taking the narcotics due to causing constipation. Patient understood and agreed with plan.

## 2020-10-22 ENCOUNTER — Telehealth: Payer: Self-pay | Admitting: Plastic Surgery

## 2020-10-22 ENCOUNTER — Telehealth: Payer: Self-pay

## 2020-10-22 NOTE — Telephone Encounter (Signed)
Patient called to say that it feels like she has something sticking in her eyeball on both sides.  Patient said that she cleaned them up and put vaseline back on them.  She said it feels like they are stitches.  Patient said it's in the inside corner on her right eye and a little more to the left on her left eye.  Please call.

## 2020-10-22 NOTE — Telephone Encounter (Signed)
Patient called to find out shower instructions. She has a drain and the compression garment and wants to make sure it's okay to shower with the drain and if the gauze under compression garment should be removed for shower and then replaced. Please call [phone] to advise.

## 2020-10-22 NOTE — Telephone Encounter (Addendum)
Returned patients call. Surgery for liposuction of thighs/neck & jowls lower blepharoplasty abdominoplasty with liposuction was on 10/19/20.   LMVM, Advised patient not  to remove binder and ace wraps until 3 full days after surgery. She may take a shower by holding the drain bulb in one hand, let soap run down shoulders, avoid direct contact from water coming out from the shower head.  Replace with new/clean dressing as needed, and continue to keep compression on 24/7.   Returned patients call. LMVM-per [place], there are sutures below her eyelid and were tact on the outer corners as well. This is normal to feel like something is sticking in her eye. Continue to coat with Vaseline. Call if any changes with fever, chills, nausea, or vomiting.

## 2020-10-25 ENCOUNTER — Other Ambulatory Visit: Payer: Self-pay

## 2020-10-25 ENCOUNTER — Ambulatory Visit (INDEPENDENT_AMBULATORY_CARE_PROVIDER_SITE_OTHER): Payer: 59 | Admitting: Plastic Surgery

## 2020-10-25 DIAGNOSIS — Z411 Encounter for cosmetic surgery: Secondary | ICD-10-CM

## 2020-10-25 NOTE — Progress Notes (Signed)
Patient presents 1 week postop from cosmetic surgery.  We did liposuction of the posterior flanks and posterior lateral thighs.  Also performed transconjunctival lower blepharoplasty with fat grafting and neck liposuction in addition to abdominoplasty and medial thigh liposuction.  Overall she is happy and feels like things are going well.  On exam her eyelid incisions are healing nicely.  Overall position of her lid is good and it is right out at the level of the lower limbus or slightly higher with an appropriate vector.  There is some lower lid edema that seems to draw the posterior aspect of the lid away from the globe in certain positions but this seems very mild at this point.  There is some bruising and swelling in the cheeks where the fat grafting was performed.  Her neck improvement is already obvious with a significant improvement in the submental fat and the contour throughout the neck particularly from the lateral view.  She does report a subtle change in her smile dynamics.  At rest she is symmetric however with animation she feels that her lower lip moves slightly different than usual in a symmetric fashion.  She does have evidence of facial nerve function throughout and appears to have symmetric motion on exam.  I expect that this change is related to traction from the neck liposuction and particularly because it symmetric in nature would expect it to dissipate over the next few weeks.  All the areas that were liposuction in her posterior flanks and thighs are bruised but look to be doing fine.  No evidence of infection or subcutaneous fluid collections.  Her abdominoplasty incision is nearly healing nicely with no obvious subcutaneous fluid.  No signs of skin compromise.  Drain was putting out about 50 cc/day as of 2 or 3 days ago.  The most recent 24-hour.  It put out 10 to 20 cc.  We will plan to leave it in a bit longer to be conservative.  Overall I think she is doing as expected and reasonably  well given the extent of her operation.  I have asked her to massage her lower eyelids in an upward direction to encourage them to soften out.  We will plan to continue to compress as much as possible and areas that were suctioned and avoid strenuous activity.  I will plan to see her next week and will likely be able to remove her abdominal drain.  All of her questions were answered

## 2020-10-26 ENCOUNTER — Ambulatory Visit: Payer: 59

## 2020-10-31 ENCOUNTER — Telehealth: Payer: Self-pay

## 2020-10-31 NOTE — Telephone Encounter (Signed)
Returned patients call. Surgery was performed on 10/19/20 with Dr. Claudia Desanctis for Abdominoplasty, liposuction-flanks, thighs/neck, and blepharoplasty. She woke up this morning with the right drain site located at the hip was very tender, warm to touch about the size of her palm. The dressings have not been changed since yesterday, still on today, and has not looked at the area to be able to observe the site for a better description. Denies any fever, chills, nausea, nor vomiting. The output has been decreasing, 6/13/-83mls, 6/14-17mls, 6/15-10mls (was emptied this morning) with a gold/yellowish color. Advised it is possible the tenderness/irritation is coming from pulling spanks up and down, or  sleeping on it. Continue to alternate Tylenol with IBU, may put a cold press on, but not directly on the site, nor skin. Dr. Claudia Desanctis will see her at her follow up appointment tomorrow at 8:00am. Patient understood and agreed with plan.

## 2020-10-31 NOTE — Telephone Encounter (Signed)
Joanne Hunt called to see if she could be seen today for her post op. She is scheduled for tomorrow but has concerns that the drain may not be working properly. Please call to advise. [phone]. Thanks.

## 2020-11-01 ENCOUNTER — Other Ambulatory Visit: Payer: Self-pay

## 2020-11-01 ENCOUNTER — Ambulatory Visit (INDEPENDENT_AMBULATORY_CARE_PROVIDER_SITE_OTHER): Payer: Self-pay

## 2020-11-01 VITALS — BP 132/85 | HR 80 | Temp 97.8°F | Ht 64.0 in | Wt 150.0 lb

## 2020-11-01 DIAGNOSIS — Z411 Encounter for cosmetic surgery: Secondary | ICD-10-CM

## 2020-11-07 ENCOUNTER — Telehealth: Payer: Self-pay

## 2020-11-07 NOTE — Telephone Encounter (Signed)
Return patients call. Per Dr. Claudia Desanctis, no Halo laser until 3-4 weeks out from today. He will discuss more in detail next week at her follow up appointment.

## 2020-11-07 NOTE — Telephone Encounter (Signed)
Patient called with questions about the Halo laser. She had recent surgery (10/19/20) on her eyelids and chin area and wanted to know how soon after she will be able to do the laser treatment with Dr. Marla Roe.

## 2020-11-15 ENCOUNTER — Ambulatory Visit (INDEPENDENT_AMBULATORY_CARE_PROVIDER_SITE_OTHER): Payer: 59 | Admitting: Plastic Surgery

## 2020-11-15 ENCOUNTER — Other Ambulatory Visit: Payer: Self-pay

## 2020-11-15 DIAGNOSIS — Z411 Encounter for cosmetic surgery: Secondary | ICD-10-CM

## 2020-11-15 NOTE — Progress Notes (Signed)
Patient presents 4 weeks postop from cosmetic surgery.  We did liposuction on the lower back and thighs.  She also had a lower lid blepharoplasty with fat grafting to the face and neck liposuction.  In addition to a tummy tuck.  She is overall feeling very happy with her progress thus far.  Her main concerns are some residual weakness in her right lower lip and some persistent swelling in the infraorbital areas.  She also has a firm spot along her incision for her abdominoplasty.  On exam her bilateral lower eyelids look to be in great position relative to the limbus.  She has no scleral irritation.  Eyelid itself is well adherent to the globe throughout.  There is still some mild infraorbital swelling and bruising which seems to be improving as time goes by.  She does have some residual right marginal mandibular weakness that is likely due to the liposuction.  She has noticed an improvement in some of the musculature motions and to my eye today her function does seem improved from her last visits.  At rest and with most animation there is good symmetry with subtle motion related irregularities.  I do anticipate that this will continue to improve with time.  In regards to her abdominoplasty she has a great result.  The area of firmness I believe is just some firm scar tissue right at the central aspect of her incision.  I suspect this will smooth out with time.  Overall were both very happy with her progress thus far.  I will ask her to continue to massage her lids and upward direction and hopefully this will help with some of the edema as well.  I am planning to see her back again in about a month.  All her questions were answered.

## 2020-11-22 NOTE — Progress Notes (Signed)
11/01/20- patient here for post-op visit. S/P cosmetic surgery by Dr. Claudia Desanctis on 10/19/20. She reports pain is minimal in her chin/neck & lower eye areas.  She does have slight bruising lower eyelids.  She denies any difficulty with her vision. She indicated that she is having lower lip asymmetry when she smiles or puckers her lips.  She noticed that she has had some "drooling" with drinking fluids d/t this. On exam she does have asymmetry of lower lip with smiling & animation/puckering.  Her tongue is symmetrical at rest & with protrusion. Her teeth/bite are aligned  I informed her that I would consult with Dr. Claudia Desanctis regarding this symptom- but that it is most likely d/t the liposuction of chin & irritation of nerves.  C/O pain  in her abdomen- & at the drain site. Drainage= 10-68ml/24hrs. X 4 days She does not have any visible fluid accumulation/shift noted so I removed the drain- & applied vaseline/gauze. incisions are intact with almost no bruising in her abdomen/flank areas. She continues to use the compression garments & overall she is pleased with the results. She has increased her daily activities - without heavy lifting/pulling . She is eating/drinking & normal bowel/bladder She is reminded to call for any concerns. Photos were taken & entered into chart.

## 2020-11-22 NOTE — Patient Instructions (Signed)
Patient is reminded to call for any concerns- She will use vaseline/gauze at drain site until healed.

## 2020-12-18 ENCOUNTER — Ambulatory Visit
Admission: RE | Admit: 2020-12-18 | Discharge: 2020-12-18 | Disposition: A | Discharge status: Home / Self Care | Payer: 59 | Source: Ambulatory Visit | Attending: Internal Medicine | Admitting: Internal Medicine

## 2020-12-18 ENCOUNTER — Other Ambulatory Visit: Payer: Self-pay

## 2020-12-18 DIAGNOSIS — Z1231 Encounter for screening mammogram for malignant neoplasm of breast: Secondary | ICD-10-CM

## 2020-12-19 ENCOUNTER — Ambulatory Visit (INDEPENDENT_AMBULATORY_CARE_PROVIDER_SITE_OTHER): Payer: Self-pay | Admitting: Plastic Surgery

## 2020-12-19 DIAGNOSIS — Z411 Encounter for cosmetic surgery: Secondary | ICD-10-CM

## 2020-12-19 NOTE — Progress Notes (Signed)
Patient presents 2 months postop from bilateral lower lid Bleph with fat grafting, neck liposuction, abdominoplasty, and thigh liposuction.  Overall she is still very happy with how things are going.  She still is having some asymmetries with lip movement.  She also has a small firm area along her abdominoplasty cord scar centrally that she has been watching.  Otherwise everything else is very good for her.  On exam her eyelids look to be in good position with nice filling out of the hollows that were present preoperatively.  She does still have some asymmetry in the marginal mandibular branch on the right side.  This has improved from early postop based on pictures with animation that we took early after surgery.  She has a nice result from her abdominoplasty.  There was what appeared to be a spitting suture inferiorly from the umbilicus which I removed and there is a still a small firm area centrally along her abdominoplasty scar but no erythema and no real concern for an underlying fluid collection.  This is probably some small area of fat necrosis or scar.  I did offer to inject Botox in the depressor anguli oris on the left side which she accepted and that area was prepped with an alcohol pad and about 4 units were injected in that area to try to help with symmetry as the right side continues to come back.  I did discuss with her that the fact that there has been some improvement and she can intermittently feel some twitches in the muscle is a sign of neurologic recovery.  I would anticipate that eventually that will fully recover but it will take some more time.  I am planning to see her again in about 6 weeks to check her progress.  In the interim she is interested in some laser treatments for her face.  We discussed pigment lasers and resurfacing lasers.  She is going inquire about those but I think she would be a reasonable candidate for both.

## 2020-12-24 ENCOUNTER — Telehealth: Payer: Self-pay | Admitting: Plastic Surgery

## 2020-12-24 NOTE — Telephone Encounter (Signed)
Patient wanted to speak to [facility] or [place] about some feedback after her last appt. She did not want to leave any additional information.Please call patient to discuss.

## 2020-12-24 NOTE — Telephone Encounter (Signed)
Returned patients call.  Indicated she had pain on the right side of lips. The left side of her lip where she had Botox has drastically changed for the better and  looking symmetrically, along with the way she talks. She is very please with the outcome.

## 2021-01-10 ENCOUNTER — Ambulatory Visit (INDEPENDENT_AMBULATORY_CARE_PROVIDER_SITE_OTHER): Payer: Self-pay

## 2021-01-10 ENCOUNTER — Other Ambulatory Visit: Payer: Self-pay

## 2021-01-10 VITALS — BP 132/85 | Ht 64.0 in | Wt 150.0 lb

## 2021-01-10 DIAGNOSIS — Z719 Counseling, unspecified: Secondary | ICD-10-CM

## 2021-01-12 NOTE — Patient Instructions (Signed)
Patient is reminded to use sunscreen & moisturizer She understands that she may have redness/irritation for 2-3 days. Call for any concerns

## 2021-01-12 NOTE — Progress Notes (Signed)
Preoperative Dx: facial hyperpigmentation  Postoperative Dx:  same  Procedure: laser to face  Anesthesia: EMLA -pt applied 40 mins prior to procedure  Description of Procedure:  Risks and complications were explained to the patient. Consent was confirmed and signed. Time out was called and all information was confirmed to be correct. The area  was prepped with alcohol and wiped dry.  The IPL  laser was set at the following:  skin -2/ suntan light - 6.4J &  FRAX laser = 26.0/21 - 3 passes The entire face was lasered The patient tolerated the procedure well and there were no complications.  Aloe & after laser balm  applied

## 2021-01-16 ENCOUNTER — Other Ambulatory Visit: Payer: 59 | Admitting: Surgical

## 2021-01-30 ENCOUNTER — Ambulatory Visit (INDEPENDENT_AMBULATORY_CARE_PROVIDER_SITE_OTHER): Payer: 59 | Admitting: Plastic Surgery

## 2021-01-30 ENCOUNTER — Encounter: Payer: Self-pay | Admitting: Plastic Surgery

## 2021-01-30 ENCOUNTER — Other Ambulatory Visit: Payer: Self-pay

## 2021-01-30 DIAGNOSIS — Z411 Encounter for cosmetic surgery: Secondary | ICD-10-CM

## 2021-01-30 DIAGNOSIS — Z719 Counseling, unspecified: Secondary | ICD-10-CM

## 2021-01-30 NOTE — Progress Notes (Signed)
Patient presents few months postop from liposuction to the lower back and lateral and medial thighs, abdominoplasty, lower lid blepharoplasty with fat grafting, liposuction to the neck.  She overall feels great at this point.  She did have a temporary marginal mandibular palsy on the right side which lasted a while but has subsequently resolved.  She reports normal function and symmetry in her mouth and lip movement.  She overall very happy with her result and wants to talk about a periareolar mastopexy.  On exam she has a great result on her face and her abdomen.  There is a bit of pigmentation in the scars around her umbilicus and her inferior incision is well and she says that this is just how she scars and anticipates that they will lighten up.  She has a great contour to her abdomen.  Regarding her breast she has had a bit of descent of her breast tissue off the implants.  She has reasonable size and position of the implants and is overall happy with her size and symmetry overall but the shape is what bothers her the most.  Her areola are also somewhat large for her proportionally.  I do think she is a good candidate for periareolar mastopexy.  She did bring up doing this in the office and I explained the procedure to her and do think that it would be essentially a D epithelialization around the areola which would be able to be accomplished under local anesthetic.  We will plan to provide a quote to her for this.  I explained the procedure to her in detail.  We talked about the location and orientation of the scars.  She is getting another laser treatment which has shown some progress in helping her with smoothness and even pigmentation.

## 2021-02-11 ENCOUNTER — Other Ambulatory Visit: Payer: Self-pay

## 2021-02-11 ENCOUNTER — Ambulatory Visit (INDEPENDENT_AMBULATORY_CARE_PROVIDER_SITE_OTHER): Payer: Self-pay

## 2021-02-11 VITALS — BP 125/74 | HR 71 | Temp 97.3°F | Ht 63.0 in | Wt 142.0 lb

## 2021-02-11 DIAGNOSIS — Z719 Counseling, unspecified: Secondary | ICD-10-CM

## 2021-02-12 NOTE — Progress Notes (Signed)
Preoperative Dx: facial aging & hyperpigmentation  Postoperative Dx:  same  Procedure: laser to face  Anesthesia: EMLA -pt applied 30 mins prior to procedure  Description of Procedure:  Risks and complications were explained to the patient. Consent was confirmed and signed. Time out was called and all information was confirmed to be correct.  The area  was prepped with alcohol and wiped dry.  The IPL & FRAX laser was set to the following : skin type= 1 / sun = light IPL performed to hyperpigmented areas on cheeks/forehead = 7.0J & decreased to 6.0J to under eyes/mouth The entire face was lasered The patient tolerated the procedure well and there were no complications.  Aloe applied

## 2021-02-12 NOTE — Patient Instructions (Signed)
Patient is reminded to avoid sun exposure & use sunscreen/moisturizer. Do not use any retinol products or harsh exfoliating scrub/washes for approx 5 days Call for any concerns

## 2021-03-12 ENCOUNTER — Other Ambulatory Visit: Payer: Self-pay | Admitting: Plastic Surgery

## 2021-12-26 ENCOUNTER — Other Ambulatory Visit: Payer: Self-pay | Admitting: Internal Medicine

## 2021-12-26 DIAGNOSIS — Z1231 Encounter for screening mammogram for malignant neoplasm of breast: Secondary | ICD-10-CM

## 2022-01-17 ENCOUNTER — Ambulatory Visit
Admission: RE | Admit: 2022-01-17 | Discharge: 2022-01-17 | Disposition: A | Discharge status: Home / Self Care | Payer: 59 | Source: Ambulatory Visit | Attending: Internal Medicine | Admitting: Internal Medicine

## 2022-01-17 DIAGNOSIS — Z1231 Encounter for screening mammogram for malignant neoplasm of breast: Secondary | ICD-10-CM

## 2023-03-17 ENCOUNTER — Encounter: Payer: Self-pay | Admitting: Internal Medicine

## 2023-03-17 DIAGNOSIS — Z1231 Encounter for screening mammogram for malignant neoplasm of breast: Secondary | ICD-10-CM

## 2023-04-07 ENCOUNTER — Other Ambulatory Visit: Payer: Self-pay | Admitting: Internal Medicine

## 2023-04-07 DIAGNOSIS — Z1231 Encounter for screening mammogram for malignant neoplasm of breast: Secondary | ICD-10-CM

## 2023-05-05 ENCOUNTER — Ambulatory Visit
Admission: RE | Admit: 2023-05-05 | Discharge: 2023-05-05 | Disposition: A | Discharge status: Home / Self Care | Payer: 59 | Source: Ambulatory Visit | Attending: Internal Medicine | Admitting: Internal Medicine

## 2023-05-05 DIAGNOSIS — Z1231 Encounter for screening mammogram for malignant neoplasm of breast: Secondary | ICD-10-CM

## 2023-07-02 ENCOUNTER — Other Ambulatory Visit: Payer: Self-pay | Admitting: Orthopedic Surgery

## 2023-08-10 ENCOUNTER — Ambulatory Visit (HOSPITAL_BASED_OUTPATIENT_CLINIC_OR_DEPARTMENT_OTHER): Admit: 2023-08-10 | Payer: 59 | Admitting: Orthopedic Surgery

## 2023-08-10 ENCOUNTER — Encounter (HOSPITAL_BASED_OUTPATIENT_CLINIC_OR_DEPARTMENT_OTHER): Payer: Self-pay

## 2023-08-10 SURGERY — CARPAL TUNNEL RELEASE
Anesthesia: Choice | Laterality: Right

## 2023-08-20 ENCOUNTER — Encounter: Payer: Self-pay | Admitting: Gastroenterology

## 2023-11-06 ENCOUNTER — Ambulatory Visit: Admitting: Gastroenterology
